# Patient Record
Sex: Female | Born: 1955
Health system: Southern US, Community
[De-identification: ages and names within clinical notes are randomized; demographics above are authoritative.]

## PROBLEM LIST (undated history)

## (undated) DIAGNOSIS — I2699 Other pulmonary embolism without acute cor pulmonale: Secondary | ICD-10-CM

## (undated) DIAGNOSIS — R739 Hyperglycemia, unspecified: Secondary | ICD-10-CM

## (undated) HISTORY — PX: BACK SURGERY: SHX140

## (undated) HISTORY — DX: Hyperglycemia, unspecified: R73.9

---

## 1999-09-20 ENCOUNTER — Other Ambulatory Visit: Admission: RE | Admit: 1999-09-20 | Discharge: 1999-09-20 | Payer: Self-pay | Admitting: Family Medicine

## 1999-10-19 ENCOUNTER — Encounter (INDEPENDENT_AMBULATORY_CARE_PROVIDER_SITE_OTHER): Payer: Self-pay

## 1999-10-19 ENCOUNTER — Other Ambulatory Visit: Admission: RE | Admit: 1999-10-19 | Discharge: 1999-10-19 | Payer: Self-pay | Admitting: Obstetrics and Gynecology

## 2000-10-01 ENCOUNTER — Other Ambulatory Visit: Admission: RE | Admit: 2000-10-01 | Discharge: 2000-10-01 | Payer: Self-pay | Admitting: Obstetrics and Gynecology

## 2000-10-09 ENCOUNTER — Encounter: Admission: RE | Admit: 2000-10-09 | Discharge: 2000-10-09 | Payer: Self-pay | Admitting: Obstetrics and Gynecology

## 2000-10-09 ENCOUNTER — Encounter: Payer: Self-pay | Admitting: Obstetrics and Gynecology

## 2001-12-02 ENCOUNTER — Encounter: Admission: RE | Admit: 2001-12-02 | Discharge: 2001-12-02 | Payer: Self-pay | Admitting: Obstetrics and Gynecology

## 2001-12-02 ENCOUNTER — Encounter: Payer: Self-pay | Admitting: Obstetrics and Gynecology

## 2001-12-31 ENCOUNTER — Other Ambulatory Visit: Admission: RE | Admit: 2001-12-31 | Discharge: 2001-12-31 | Payer: Self-pay | Admitting: Obstetrics and Gynecology

## 2003-02-19 ENCOUNTER — Encounter: Payer: Self-pay | Admitting: Obstetrics and Gynecology

## 2003-02-19 ENCOUNTER — Encounter: Admission: RE | Admit: 2003-02-19 | Discharge: 2003-02-19 | Payer: Self-pay | Admitting: Obstetrics and Gynecology

## 2003-04-20 ENCOUNTER — Other Ambulatory Visit: Admission: RE | Admit: 2003-04-20 | Discharge: 2003-04-20 | Payer: Self-pay | Admitting: Obstetrics and Gynecology

## 2004-04-19 ENCOUNTER — Encounter: Admission: RE | Admit: 2004-04-19 | Discharge: 2004-04-19 | Payer: Self-pay | Admitting: Obstetrics and Gynecology

## 2005-05-02 ENCOUNTER — Encounter: Admission: RE | Admit: 2005-05-02 | Discharge: 2005-05-02 | Payer: Self-pay | Admitting: Obstetrics and Gynecology

## 2009-10-19 ENCOUNTER — Encounter: Admission: RE | Admit: 2009-10-19 | Discharge: 2009-10-19 | Payer: Self-pay | Admitting: Obstetrics and Gynecology

## 2009-10-26 ENCOUNTER — Encounter: Admission: RE | Admit: 2009-10-26 | Discharge: 2009-10-26 | Payer: Self-pay | Admitting: Obstetrics and Gynecology

## 2010-08-14 ENCOUNTER — Encounter: Payer: Self-pay | Admitting: Obstetrics and Gynecology

## 2010-11-03 ENCOUNTER — Other Ambulatory Visit: Payer: Self-pay | Admitting: Obstetrics and Gynecology

## 2010-11-03 DIAGNOSIS — Z1231 Encounter for screening mammogram for malignant neoplasm of breast: Secondary | ICD-10-CM

## 2010-11-11 ENCOUNTER — Other Ambulatory Visit (HOSPITAL_BASED_OUTPATIENT_CLINIC_OR_DEPARTMENT_OTHER): Payer: Self-pay | Admitting: Family Medicine

## 2010-11-11 ENCOUNTER — Ambulatory Visit
Admission: RE | Admit: 2010-11-11 | Discharge: 2010-11-11 | Disposition: A | Discharge status: Home / Self Care | Payer: BC Managed Care – PPO | Source: Ambulatory Visit | Attending: Obstetrics and Gynecology | Admitting: Obstetrics and Gynecology

## 2010-11-11 DIAGNOSIS — E049 Nontoxic goiter, unspecified: Secondary | ICD-10-CM

## 2010-11-11 DIAGNOSIS — Z1231 Encounter for screening mammogram for malignant neoplasm of breast: Secondary | ICD-10-CM

## 2010-11-15 ENCOUNTER — Ambulatory Visit (HOSPITAL_BASED_OUTPATIENT_CLINIC_OR_DEPARTMENT_OTHER)
Admission: RE | Admit: 2010-11-15 | Discharge: 2010-11-15 | Disposition: A | Discharge status: Home / Self Care | Payer: BC Managed Care – PPO | Source: Ambulatory Visit | Attending: Family Medicine | Admitting: Family Medicine

## 2010-11-15 DIAGNOSIS — R1310 Dysphagia, unspecified: Secondary | ICD-10-CM | POA: Insufficient documentation

## 2010-11-15 DIAGNOSIS — E0789 Other specified disorders of thyroid: Secondary | ICD-10-CM | POA: Insufficient documentation

## 2010-11-15 DIAGNOSIS — E049 Nontoxic goiter, unspecified: Secondary | ICD-10-CM

## 2011-10-19 ENCOUNTER — Other Ambulatory Visit: Payer: Self-pay | Admitting: Obstetrics and Gynecology

## 2011-10-19 DIAGNOSIS — Z1231 Encounter for screening mammogram for malignant neoplasm of breast: Secondary | ICD-10-CM

## 2011-11-13 ENCOUNTER — Ambulatory Visit
Admission: RE | Admit: 2011-11-13 | Discharge: 2011-11-13 | Disposition: A | Discharge status: Home / Self Care | Payer: Managed Care, Other (non HMO) | Source: Ambulatory Visit | Attending: Obstetrics and Gynecology | Admitting: Obstetrics and Gynecology

## 2011-11-13 DIAGNOSIS — Z1231 Encounter for screening mammogram for malignant neoplasm of breast: Secondary | ICD-10-CM

## 2012-11-14 ENCOUNTER — Other Ambulatory Visit: Payer: Self-pay

## 2012-11-14 DIAGNOSIS — Z1231 Encounter for screening mammogram for malignant neoplasm of breast: Secondary | ICD-10-CM

## 2012-11-28 ENCOUNTER — Other Ambulatory Visit: Payer: Self-pay | Admitting: Obstetrics and Gynecology

## 2012-11-28 ENCOUNTER — Ambulatory Visit
Admission: RE | Admit: 2012-11-28 | Discharge: 2012-11-28 | Disposition: A | Discharge status: Home / Self Care | Payer: Managed Care, Other (non HMO) | Source: Ambulatory Visit

## 2012-11-28 DIAGNOSIS — R928 Other abnormal and inconclusive findings on diagnostic imaging of breast: Secondary | ICD-10-CM

## 2012-11-28 DIAGNOSIS — Z1231 Encounter for screening mammogram for malignant neoplasm of breast: Secondary | ICD-10-CM

## 2012-12-10 ENCOUNTER — Ambulatory Visit
Admission: RE | Admit: 2012-12-10 | Discharge: 2012-12-10 | Disposition: A | Discharge status: Home / Self Care | Payer: Managed Care, Other (non HMO) | Source: Ambulatory Visit | Attending: Obstetrics and Gynecology | Admitting: Obstetrics and Gynecology

## 2012-12-10 DIAGNOSIS — R928 Other abnormal and inconclusive findings on diagnostic imaging of breast: Secondary | ICD-10-CM

## 2013-10-31 ENCOUNTER — Other Ambulatory Visit: Payer: Self-pay

## 2013-10-31 DIAGNOSIS — Z1231 Encounter for screening mammogram for malignant neoplasm of breast: Secondary | ICD-10-CM

## 2013-12-01 ENCOUNTER — Ambulatory Visit
Admission: RE | Admit: 2013-12-01 | Discharge: 2013-12-01 | Disposition: A | Discharge status: Home / Self Care | Payer: Managed Care, Other (non HMO) | Source: Ambulatory Visit

## 2013-12-01 ENCOUNTER — Encounter (INDEPENDENT_AMBULATORY_CARE_PROVIDER_SITE_OTHER): Payer: Self-pay

## 2013-12-01 DIAGNOSIS — Z1231 Encounter for screening mammogram for malignant neoplasm of breast: Secondary | ICD-10-CM

## 2013-12-03 ENCOUNTER — Other Ambulatory Visit: Payer: Self-pay | Admitting: Obstetrics and Gynecology

## 2013-12-03 DIAGNOSIS — R928 Other abnormal and inconclusive findings on diagnostic imaging of breast: Secondary | ICD-10-CM

## 2013-12-17 ENCOUNTER — Ambulatory Visit
Admission: RE | Admit: 2013-12-17 | Discharge: 2013-12-17 | Disposition: A | Discharge status: Home / Self Care | Payer: Managed Care, Other (non HMO) | Source: Ambulatory Visit | Attending: Obstetrics and Gynecology | Admitting: Obstetrics and Gynecology

## 2013-12-17 DIAGNOSIS — R928 Other abnormal and inconclusive findings on diagnostic imaging of breast: Secondary | ICD-10-CM

## 2014-02-12 ENCOUNTER — Encounter: Payer: Self-pay | Admitting: *Deleted

## 2014-02-25 ENCOUNTER — Ambulatory Visit (INDEPENDENT_AMBULATORY_CARE_PROVIDER_SITE_OTHER): Payer: Managed Care, Other (non HMO) | Admitting: Cardiology

## 2014-02-25 ENCOUNTER — Encounter: Payer: Self-pay | Admitting: Cardiology

## 2014-02-25 VITALS — BP 126/80 | HR 69 | Ht 66.0 in | Wt 180.0 lb

## 2014-02-25 DIAGNOSIS — R002 Palpitations: Secondary | ICD-10-CM | POA: Insufficient documentation

## 2014-02-25 NOTE — Patient Instructions (Signed)
Your physician recommends that you schedule a follow-up appointment in: one year with Dr. Percival Spanish  We have ordered a stress test  We have ordered at CT [facility]

## 2014-02-25 NOTE — Progress Notes (Signed)
   HPI The patient presents for evaluation of palpitations. She had these several weeks ago when she saw her primary provider. She was told to stop caffeine which didn't help. She would notice his palpitations more at night but also occasionally during the day. She wasn't sure what it was irregular or just a strong beat. She's not had any presyncope with this. She could not make them, otherwise. They are less bothersome now. She has been a little short of breath at times walking up inclines. However, she's not having any resting shortness of breath and she denies any PND or orthopnea.  Allergies  Allergen Reactions  . Sulfa Antibiotics     No current outpatient prescriptions on file.   No current facility-administered medications for this visit.    No past medical history on file.  No past surgical history on file.  No family history on file.  History   Social History  . Marital Status: Married    Spouse Name: N/A    Number of Children: N/A  . Years of Education: N/A   Occupational History  . Not on file.   Social History Main Topics  . Smoking status: Former Research scientist (life sciences)  . Smokeless tobacco: Not on file     Comment: 8 years ago  . Alcohol Use: Not on file  . Drug Use: Not on file  . Sexual Activity: Not on file   Other Topics Concern  . Not on file   Social History Narrative  . No narrative on file    ROS:   As stated in the HPI and negative for all other systems.  PHYSICAL EXAM Ht 5\' 6"  (1.676 m)  Wt 180 lb (81.647 kg)  BMI 29.07 kg/m2 GENERAL:  Well appearing HEENT:  Pupils equal round and reactive, fundi not visualized, oral mucosa unremarkable NECK:  No jugular venous distention, waveform within normal limits, carotid upstroke brisk and symmetric, no bruits, no thyromegaly LYMPHATICS:  No cervical, inguinal adenopathy LUNGS:  Clear to auscultation bilaterally BACK:  No CVA tenderness CHEST:  Unremarkable HEART:  PMI not displaced or sustained,S1 and S2  within normal limits, no S3, no S4, no clicks, no rubs, no murmurs ABD:  Flat, positive bowel sounds normal in frequency in pitch, no bruits, no rebound, no guarding, no midline pulsatile mass, no hepatomegaly, no splenomegaly EXT:  2 plus pulses throughout, no edema, no cyanosis no clubbing SKIN:  No rashes no nodules NEURO:  Cranial nerves II through XII grossly intact, motor grossly intact throughout [facility]:  Cognitively intact, oriented to person place and time   EKG:  01/20/15  Sinus rhythm, rate 71, axis within normal limits, intervals within normal limits, no acute ST-T wave changes.  ASSESSMENT AND PLAN   PALPITATIONS:  We had a long discussion about this. This point she would like to avoid any change in medications. I have any further workup is indicated.  FAMILY HISTORY OF CAD:  In a strong family history I would like to bring her back for a POET (Plain Old Exercise Treadmill) And also a coronary calcium score which would help to guide therapy.

## 2014-03-11 ENCOUNTER — Other Ambulatory Visit: Payer: Managed Care, Other (non HMO)

## 2014-03-16 ENCOUNTER — Inpatient Hospital Stay: Admission: RE | Admit: 2014-03-16 | Payer: Managed Care, Other (non HMO) | Source: Ambulatory Visit

## 2014-03-16 ENCOUNTER — Ambulatory Visit (INDEPENDENT_AMBULATORY_CARE_PROVIDER_SITE_OTHER): Payer: Managed Care, Other (non HMO) | Admitting: Physician Assistant

## 2014-03-16 DIAGNOSIS — R002 Palpitations: Secondary | ICD-10-CM

## 2014-03-16 NOTE — Progress Notes (Signed)
Exercise Treadmill Test  Pre-Exercise Testing Evaluation Rhythm: normal sinus  Rate: 80     Test  Exercise Tolerance Test Ordering MD: Marijo File, MD  Interpreting MD: Richardson Dopp, PA-C  Unique Test No: 1  Treadmill:  1  Indication for ETT: Palpitations  Contraindication to ETT: No   Stress Modality: exercise - treadmill  Cardiac Imaging Performed: non   Protocol: standard Bruce - maximal  Max BP:  217/74  Max MPHR (bpm):  160 85% MPR (bpm) 136  MPHR obtained (bpm):  162 % MPHR obtained:  101  Reached 85% MPHR (min:sec):  2:45 Total Exercise Time (min-sec):  7:00  Workload in METS:  8.5 Borg Scale: 13  Reason ETT Terminated:  desired heart rate attained    ST Segment Analysis At Rest: normal ST segments - no evidence of significant ST depression With Exercise: non-specific ST changes  Other Information Arrhythmia:  No Angina during ETT:  absent (0) Quality of ETT:  diagnostic  ETT Interpretation:  normal - no evidence of ischemia by ST analysis  Comments: Fair exercise capacity. No chest pain. Normal BP response to exercise. Insignificant up-sloping ST depression at peak exercise. No ST depression to suggest ischemia. Occasional PVCs throughout exercise. Good HR recovery in 1st minute post-exercise.  Recommendations: FU with Dr. Minus Breeding as directed. Signed,  Richardson Dopp, PA-C   03/16/2014 11:11 AM

## 2014-03-17 ENCOUNTER — Ambulatory Visit (INDEPENDENT_AMBULATORY_CARE_PROVIDER_SITE_OTHER)
Admission: RE | Admit: 2014-03-17 | Discharge: 2014-03-17 | Disposition: A | Discharge status: Home / Self Care | Payer: Self-pay | Source: Ambulatory Visit | Attending: Cardiology | Admitting: Cardiology

## 2014-03-17 DIAGNOSIS — R002 Palpitations: Secondary | ICD-10-CM

## 2016-04-25 ENCOUNTER — Other Ambulatory Visit: Payer: Self-pay | Admitting: Obstetrics and Gynecology

## 2016-04-25 DIAGNOSIS — Z1231 Encounter for screening mammogram for malignant neoplasm of breast: Secondary | ICD-10-CM

## 2016-05-04 ENCOUNTER — Ambulatory Visit
Admission: RE | Admit: 2016-05-04 | Discharge: 2016-05-04 | Disposition: A | Discharge status: Home / Self Care | Payer: Managed Care, Other (non HMO) | Source: Ambulatory Visit | Attending: Obstetrics and Gynecology | Admitting: Obstetrics and Gynecology

## 2016-05-04 DIAGNOSIS — Z1231 Encounter for screening mammogram for malignant neoplasm of breast: Secondary | ICD-10-CM

## 2016-05-12 ENCOUNTER — Other Ambulatory Visit: Payer: Self-pay | Admitting: Obstetrics and Gynecology

## 2016-05-12 ENCOUNTER — Other Ambulatory Visit: Payer: Self-pay | Admitting: Internal Medicine

## 2016-05-12 DIAGNOSIS — E2839 Other primary ovarian failure: Secondary | ICD-10-CM

## 2016-05-12 DIAGNOSIS — Z780 Asymptomatic menopausal state: Secondary | ICD-10-CM

## 2016-05-16 ENCOUNTER — Ambulatory Visit
Admission: RE | Admit: 2016-05-16 | Discharge: 2016-05-16 | Disposition: A | Discharge status: Home / Self Care | Payer: Managed Care, Other (non HMO) | Source: Ambulatory Visit | Attending: Obstetrics and Gynecology | Admitting: Obstetrics and Gynecology

## 2016-05-16 DIAGNOSIS — E2839 Other primary ovarian failure: Secondary | ICD-10-CM

## 2017-04-12 ENCOUNTER — Encounter: Payer: Self-pay | Admitting: Family Medicine

## 2017-09-28 ENCOUNTER — Other Ambulatory Visit: Payer: Self-pay

## 2017-09-28 ENCOUNTER — Inpatient Hospital Stay (HOSPITAL_BASED_OUTPATIENT_CLINIC_OR_DEPARTMENT_OTHER)
Admission: EM | Admit: 2017-09-28 | Discharge: 2017-10-01 | DRG: 176 | Disposition: A | Discharge status: Home / Self Care | Payer: 59 | Attending: Internal Medicine | Admitting: Internal Medicine

## 2017-09-28 ENCOUNTER — Emergency Department (HOSPITAL_BASED_OUTPATIENT_CLINIC_OR_DEPARTMENT_OTHER): Payer: 59

## 2017-09-28 ENCOUNTER — Encounter (HOSPITAL_BASED_OUTPATIENT_CLINIC_OR_DEPARTMENT_OTHER): Payer: Self-pay | Admitting: *Deleted

## 2017-09-28 ENCOUNTER — Inpatient Hospital Stay (HOSPITAL_BASED_OUTPATIENT_CLINIC_OR_DEPARTMENT_OTHER): Payer: 59

## 2017-09-28 DIAGNOSIS — Z7982 Long term (current) use of aspirin: Secondary | ICD-10-CM | POA: Diagnosis not present

## 2017-09-28 DIAGNOSIS — Z87891 Personal history of nicotine dependence: Secondary | ICD-10-CM

## 2017-09-28 DIAGNOSIS — R0902 Hypoxemia: Secondary | ICD-10-CM | POA: Diagnosis present

## 2017-09-28 DIAGNOSIS — R911 Solitary pulmonary nodule: Secondary | ICD-10-CM | POA: Diagnosis present

## 2017-09-28 DIAGNOSIS — J441 Chronic obstructive pulmonary disease with (acute) exacerbation: Secondary | ICD-10-CM

## 2017-09-28 DIAGNOSIS — R0609 Other forms of dyspnea: Secondary | ICD-10-CM | POA: Diagnosis present

## 2017-09-28 DIAGNOSIS — I2699 Other pulmonary embolism without acute cor pulmonale: Secondary | ICD-10-CM | POA: Diagnosis present

## 2017-09-28 DIAGNOSIS — D696 Thrombocytopenia, unspecified: Secondary | ICD-10-CM | POA: Diagnosis present

## 2017-09-28 DIAGNOSIS — Z882 Allergy status to sulfonamides status: Secondary | ICD-10-CM

## 2017-09-28 DIAGNOSIS — I340 Nonrheumatic mitral (valve) insufficiency: Secondary | ICD-10-CM | POA: Diagnosis not present

## 2017-09-28 HISTORY — DX: Other pulmonary embolism without acute cor pulmonale: I26.99

## 2017-09-28 LAB — D-DIMER, QUANTITATIVE (NOT AT ARMC): D DIMER QUANT: 1.49 ug{FEU}/mL — AB (ref 0.00–0.50)

## 2017-09-28 LAB — CBC WITH DIFFERENTIAL/PLATELET
BASOS ABS: 0 10*3/uL (ref 0.0–0.1)
BASOS PCT: 0 %
EOS ABS: 0.2 10*3/uL (ref 0.0–0.7)
EOS PCT: 4 %
HCT: 37.2 % (ref 36.0–46.0)
Hemoglobin: 12.4 g/dL (ref 12.0–15.0)
LYMPHS ABS: 2.1 10*3/uL (ref 0.7–4.0)
Lymphocytes Relative: 39 %
MCH: 30.5 pg (ref 26.0–34.0)
MCHC: 33.3 g/dL (ref 30.0–36.0)
MCV: 91.4 fL (ref 78.0–100.0)
Monocytes Absolute: 0.5 10*3/uL (ref 0.1–1.0)
Monocytes Relative: 9 %
Neutro Abs: 2.6 10*3/uL (ref 1.7–7.7)
Neutrophils Relative %: 48 %
PLATELETS: 147 10*3/uL — AB (ref 150–400)
RBC: 4.07 MIL/uL (ref 3.87–5.11)
RDW: 12.8 % (ref 11.5–15.5)
WBC: 5.4 10*3/uL (ref 4.0–10.5)

## 2017-09-28 LAB — BASIC METABOLIC PANEL
Anion gap: 11 (ref 5–15)
BUN: 12 mg/dL (ref 6–20)
CALCIUM: 9.2 mg/dL (ref 8.9–10.3)
CO2: 26 mmol/L (ref 22–32)
CREATININE: 0.93 mg/dL (ref 0.44–1.00)
Chloride: 104 mmol/L (ref 101–111)
GFR calc Af Amer: >60 mL/min (ref >60)
Glucose, Bld: 112 mg/dL — ABNORMAL HIGH (ref 65–99)
Potassium: 3.2 mmol/L — ABNORMAL LOW (ref 3.5–5.1)
SODIUM: 141 mmol/L (ref 135–145)

## 2017-09-28 LAB — TROPONIN I: Troponin I: <0.03 ng/mL (ref <0.03)

## 2017-09-28 LAB — BRAIN NATRIURETIC PEPTIDE: B Natriuretic Peptide: 41.5 pg/mL (ref 0.0–100.0)

## 2017-09-28 MED ORDER — IPRATROPIUM-ALBUTEROL 0.5-2.5 (3) MG/3ML IN SOLN: 3.0000 mL | Freq: Three times a day (TID) | RESPIRATORY_TRACT | Status: DC

## 2017-09-28 MED ORDER — ONDANSETRON HCL 4 MG PO TABS: 4.0000 mg | ORAL_TABLET | Freq: Four times a day (QID) | ORAL | Status: DC | PRN

## 2017-09-28 MED ORDER — METHYLPREDNISOLONE SODIUM SUCC 125 MG IJ SOLR
125.0000 mg | Freq: Once | INTRAMUSCULAR | Status: AC
  Administered 2017-09-28: 125 mg via INTRAVENOUS
  Filled 2017-09-28: qty 2

## 2017-09-28 MED ORDER — IOPAMIDOL (ISOVUE-370) INJECTION 76%
100.0000 mL | Freq: Once | INTRAVENOUS | Status: AC | PRN
  Administered 2017-09-28: 100 mL via INTRAVENOUS

## 2017-09-28 MED ORDER — ALBUTEROL SULFATE (2.5 MG/3ML) 0.083% IN NEBU: 2.5000 mg | INHALATION_SOLUTION | RESPIRATORY_TRACT | Status: DC | PRN

## 2017-09-28 MED ORDER — ENOXAPARIN SODIUM 80 MG/0.8ML ~~LOC~~ SOLN
1.0000 mg/kg | Freq: Two times a day (BID) | SUBCUTANEOUS | Status: DC
  Administered 2017-09-29 – 2017-09-30 (×3): 75 mg via SUBCUTANEOUS
  Filled 2017-09-28 (×3): qty 0.8

## 2017-09-28 MED ORDER — ACETAMINOPHEN 650 MG RE SUPP: 650.0000 mg | Freq: Four times a day (QID) | RECTAL | Status: DC | PRN

## 2017-09-28 MED ORDER — ONDANSETRON HCL 4 MG/2ML IJ SOLN: 4.0000 mg | Freq: Four times a day (QID) | INTRAMUSCULAR | Status: DC | PRN

## 2017-09-28 MED ORDER — ACETAMINOPHEN 325 MG PO TABS
650.0000 mg | ORAL_TABLET | Freq: Four times a day (QID) | ORAL | Status: DC | PRN
  Administered 2017-09-29 – 2017-09-30 (×2): 650 mg via ORAL
  Filled 2017-09-28 (×3): qty 2

## 2017-09-28 MED ORDER — ADULT MULTIVITAMIN W/MINERALS CH
1.0000 | ORAL_TABLET | Freq: Every day | ORAL | Status: DC
  Administered 2017-09-28 – 2017-10-01 (×4): 1 via ORAL
  Filled 2017-09-28 (×4): qty 1

## 2017-09-28 MED ORDER — ASPIRIN EC 81 MG PO TBEC
81.0000 mg | DELAYED_RELEASE_TABLET | Freq: Every day | ORAL | Status: DC
  Administered 2017-09-28 – 2017-09-30 (×3): 81 mg via ORAL
  Filled 2017-09-28 (×3): qty 1

## 2017-09-28 MED ORDER — IPRATROPIUM-ALBUTEROL 0.5-2.5 (3) MG/3ML IN SOLN
3.0000 mL | Freq: Four times a day (QID) | RESPIRATORY_TRACT | Status: DC
  Administered 2017-09-28 (×2): 3 mL via RESPIRATORY_TRACT
  Filled 2017-09-28 (×2): qty 3

## 2017-09-28 MED ORDER — ENOXAPARIN SODIUM 80 MG/0.8ML ~~LOC~~ SOLN
1.0000 mg/kg | Freq: Once | SUBCUTANEOUS | Status: AC
  Administered 2017-09-28: 18:00:00 75 mg via SUBCUTANEOUS
  Filled 2017-09-28: qty 0.8

## 2017-09-28 NOTE — Progress Notes (Addendum)
ANTICOAGULATION CONSULT NOTE - Initial Consult  Pharmacy Consult for LMWH Indication: pulmonary embolus  Allergies  Allergen Reactions  . Sulfa Antibiotics Hives    Patient Measurements: Height: 5\' 6"  (167.6 cm) Weight: 170 lb (77.1 kg) IBW/kg (Calculated) : 59.3   Vital Signs: Temp: 98.4 F (36.9 C) (03/08 2059) Temp Source: Oral (03/08 2059) BP: 148/71 (03/08 2059) Pulse Rate: 84 (03/08 2059)  Labs: Recent Labs    09/28/17 1401  HGB 12.4  HCT 37.2  PLT 147*  CREATININE 0.93  TROPONINI <0.03    Estimated Creatinine Clearance: 64.1 mL/min (by C-G formula based on SCr of 0.93 mg/dL).   Medical History: Past Medical History:  Diagnosis Date  . Elevated blood sugar     Medications:  Medications Prior to Admission  Medication Sig Dispense Refill Last Dose  . aspirin 81 MG tablet Take 81 mg by mouth daily.   09/27/2017 at Unknown time  . KRILL OIL PO Take 750 mg by mouth. 600mg  daily    09/27/2017 at Unknown time  . Multiple Vitamin (MULTIVITAMIN) tablet Take 1 tablet by mouth daily.   09/27/2017 at Unknown time  . Vitamin D, Cholecalciferol, 1000 UNITS TABS Take by mouth. VITAMIN D # 3  2000ius daily   09/27/2017 at Unknown time  . azithromycin (ZITHROMAX) 250 MG tablet TAKE 2 TABLETS BY MOUTH TODAY, THEN TAKE 1 TABLET DAILY FOR 4 DAYS  0 09/25/2017 at Unknown time  . PROAIR RESPICLICK 854 (90 Base) MCG/ACT AEPB 2 puffs every 4 (four) hours.  0   . Probiotic Product (PROBIOTIC DAILY PO) Take by mouth.   Taking  . vitamin B-12 (CYANOCOBALAMIN) 1000 MCG tablet Take 1,000 mcg by mouth. liquiod  Also contains folic acid   Taking    Assessment: 62 yo F with RUL PE to start LMWH per pharmacy.  LMWH 75 mg given at [facility] at 1736.  Cr WNL, Hg WNL, PLTC low at 147.   Goal of Therapy:  Anti-Xa level 0.6-1 units/ml 4hrs after LMWH dose given Monitor platelets by anticoagulation protocol: Yes   Plan:  LMWH 1 mg/kg q12 F/u transition to oral agent  Eudelia Bunch,  Pharm.D. [phone] 09/28/2017 9:38 PM

## 2017-09-28 NOTE — ED Provider Notes (Signed)
[place] EMERGENCY DEPARTMENT Provider Note   CSN: [id number] Arrival date & time: 09/28/17  1232     History   Chief Complaint Chief Complaint  Patient presents with  . Cough    HPI Christine Donaldson is a 62 y.o. female.  She is a former smoker with no significant history of any respiratory disease.  She started with a cough and upper respiratory infection that was minimally productive of sputum over a week ago and when seen at an urgent care clinic was given an IV dose of steroids Z-Pak oral steroids and an inhaler.  This is the first time she has needed to use an inhaler.  She finished all these but still noticed that she was having dyspnea on exertion and this nonproductive cough and went back to the urgent care today.  The patient has at home pulse ox and had noticed that her saturations were in the mid 80s.  She does not require home oxygen ever before.  Today at the clinic they also noticed her pulse ox was low and placed her on oxygen which improved her symptoms.  She refused an ambulance transfer and was driven here by her husband for evaluation.  She states she does not have a fever no chest pain no leg pain no edema.  She has been eating and drinking well.  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  The history is provided by the patient.  Cough  This is a new problem. The current episode started more than 1 week ago. The problem occurs every few minutes. The cough is non-productive. There has been no fever. Associated symptoms include shortness of breath and wheezing. Pertinent negatives include no chest pain, no chills, no sweats, no ear pain, no headaches, no rhinorrhea, no sore throat and no myalgias. The treatment provided no relief. Smoker: former.    Past Medical History:  Diagnosis Date  . Elevated blood sugar     Patient Active Problem List   Diagnosis Date Noted  . Palpitations 02/25/2014     Past Surgical History:  Procedure Laterality Date  . BACK SURGERY     Lumbar    OB History    No data available       Home Medications    Prior to Admission medications   Medication Sig Start Date End Date Taking? Authorizing Provider  aspirin 81 MG tablet Take 81 mg by mouth daily.   Yes [provider]  KRILL OIL PO Take by mouth. 600mg  daily   Yes [provider]  Multiple Vitamin (MULTIVITAMIN) tablet Take 1 tablet by mouth daily.   Yes [provider]  Vitamin D, Cholecalciferol, 1000 UNITS TABS Take by mouth. VITAMIN D # 3  2000ius daily   Yes [provider]  Probiotic Product (PROBIOTIC DAILY PO) Take by mouth.    [provider]  vitamin B-12 (CYANOCOBALAMIN) 1000 MCG tablet Take 1,000 mcg by mouth. liquiod  Also contains folic acid    [provider]    Family History Family History  Problem Relation Age of Onset  . CAD Mother        Died age 8  . CAD Sister 76       Died at 18 of cirrhosis  . COPD Sister 34    Social History Social History   Tobacco Use  . Smoking status: Former Smoker  Packs/day: 1.00    Years: 32.00    Pack years: 32.00    Types: Cigarettes  . Smokeless tobacco: Never Used  . Tobacco comment: 8 years ago  Substance Use Topics  . Alcohol use: Not on file  . Drug use: Not on file     Allergies   Sulfa antibiotics   Review of Systems Review of Systems  Constitutional: Negative for chills and fever.  HENT: Negative for ear pain, rhinorrhea and sore throat.   Eyes: Negative for pain and visual disturbance.  Respiratory: Positive for cough, shortness of breath and wheezing.   Cardiovascular: Negative for chest pain and palpitations.  Gastrointestinal: Negative for abdominal pain and vomiting.  Genitourinary: Negative for dysuria and hematuria.  Musculoskeletal: Negative for arthralgias, back pain and myalgias.  Skin: Negative for color change and rash.   Neurological: Negative for seizures, syncope and headaches.  All other systems reviewed and are negative.    Physical Exam Updated Vital Signs BP (!) 173/93   Pulse (!) 101   Temp 98.9 F (37.2 C) (Oral)   Resp 19   Ht 5\' 6"  (1.676 m)   Wt 77.1 kg (170 lb)   SpO2 94%   BMI 27.44 kg/m   Physical Exam  Constitutional: She appears well-developed and well-nourished. No distress.  HENT:  Head: Normocephalic and atraumatic.  Eyes: Conjunctivae are normal.  Neck: Neck supple.  Cardiovascular: Regular rhythm. Bradycardia present.  No murmur heard. Pulmonary/Chest: Effort normal. No tachypnea. No respiratory distress. She has wheezes (scattered). She has rhonchi in the right lower field and the left lower field.  Abdominal: Soft. There is no tenderness.  Musculoskeletal: She exhibits no edema.  Neurological: She is alert.  Skin: Skin is warm and dry.  Psychiatric: She has a normal mood and affect.  Nursing note and vitals reviewed.    ED Treatments / Results  Labs (all labs ordered are listed, but only abnormal results are displayed) Labs Reviewed  TROPONIN I  BASIC METABOLIC PANEL  CBC WITH DIFFERENTIAL/PLATELET  BRAIN NATRIURETIC PEPTIDE    EKG  EKG Interpretation  Date/Time:  Friday September 28 2017 12:54:29 EST Ventricular Rate:  103 PR Interval:    QRS Duration: 83 QT Interval:  322 QTC Calculation: 422 R Axis:   84 Text Interpretation:  Sinus tachycardia Borderline right axis deviation no prior to compare with Confirmed by Aletta Edouard [phone]) on 09/28/2017 1:07:09 PM       Radiology Dg Chest 2 View  Result Date: 09/28/2017 CLINICAL DATA:  Shortness of breath. EXAM: CHEST - 2 VIEW COMPARISON:  03/17/2014 chest CT FINDINGS: The cardiomediastinal silhouette is unremarkable. Mild peribronchial thickening noted. There is no evidence of focal airspace disease, pulmonary edema, suspicious pulmonary nodule/mass, pleural effusion, or pneumothorax. No acute bony  abnormalities are identified. IMPRESSION: Mild peribronchial thickening without focal pneumonia. Electronically Signed   By: Margarette Canada M.D.   On: 09/28/2017 14:31   Ct Angio Chest Pe W/cm &/or Wo Cm  Result Date: 09/28/2017 CLINICAL DATA:  62 y/o F; recent viral respiratory infection on Z-Pak and prednisone. Elevated D-dimer and shortness of breath. EXAM: CT ANGIOGRAPHY CHEST WITH CONTRAST TECHNIQUE: Multidetector CT imaging of the chest was performed using the standard protocol during bolus administration of intravenous contrast. Multiplanar CT image reconstructions and MIPs were obtained to evaluate the vascular anatomy. CONTRAST:  152mL ISOVUE-370 IOPAMIDOL (ISOVUE-370) INJECTION 76% COMPARISON:  09/28/2017 chest radiograph FINDINGS: Cardiovascular: Satisfactory opacification of the pulmonary arteries to the segmental level. Acute segmental  pulmonary emboli in the right upper lobe (series 7, image 48). Normal heart size. No pericardial effusion. Normal caliber thoracic aorta with mild calcific atherosclerosis. Mediastinum/Nodes: No enlarged mediastinal, hilar, or axillary lymph nodes. Thyroid gland, trachea, and esophagus demonstrate no significant findings. Lungs/Pleura: Mild-to-moderate centrilobular emphysema greatest in upper lobes. Diffuse tiny centrilobular ground-glass nodules in bronchovascular distribution greater in the lung bases. 5 x 4 mm, average 4.5 mm solid nodule in the left upper lobe lingula (series 5, image 61). No pleural effusion or pneumothorax. Upper Abdomen: No acute abnormality. Musculoskeletal: No chest wall abnormality. No acute or significant osseous findings. Review of the MIP images confirms the above findings. IMPRESSION: 1. Acute segmental pulmonary emboli in the right upper lobe. No findings of right heart strain. 2. Tiny ground-glass nodules in bronchovascular distribution compatible bronchiolitis which may be infectious or possibly respiratory bronchiolitis given history of  smoking. 3. 4.5 mm solid nodule in left upper lobe lingula. No follow-up needed if patient is low-risk. Non-contrast chest CT can be considered in 12 months if patient is high-risk. This recommendation follows the consensus statement: Guidelines for Management of Incidental Pulmonary Nodules Detected on CT Images: From the Fleischner Society 2017; Radiology 2017; 284:228-243. 4. Mild to moderate emphysema of lung apices. 5. Aortic atherosclerosis. These results were called by telephone at the time of interpretation on 09/28/2017 at 5:08 pm to Dr. Aletta Edouard , who verbally acknowledged these results. Electronically Signed   By: Kristine Garbe M.D.   On: 09/28/2017 17:10    Procedures .Critical Care Performed by: Hayden Rasmussen, MD Authorized by: Hayden Rasmussen, MD   Critical care provider statement:    Critical care time (minutes):  30   Critical care time was exclusive of:  Separately billable procedures and treating other patients and teaching time   Critical care was necessary to treat or prevent imminent or life-threatening deterioration of the following conditions:  Respiratory failure   Critical care was time spent personally by me on the following activities:  Discussions with consultants, development of treatment plan with patient or surrogate, discussions with primary provider, evaluation of patient's response to treatment, examination of patient, obtaining history from patient or surrogate, ordering and performing treatments and interventions, ordering and review of laboratory studies, ordering and review of radiographic studies, pulse oximetry, re-evaluation of patient's condition and review of old charts   I assumed direction of critical care for this patient from another provider in my specialty: no     (including critical care time)  Medications Ordered in ED Medications - No data to display   Initial Impression / Assessment and Plan / ED Course  I have reviewed  the triage vital signs and the nursing notes.  Pertinent labs & imaging results that were available during my care of the patient were reviewed by me and considered in my medical decision making (see chart for details).  Clinical Course as of Sep 29 1909  Fri Sep 28, 2017  1516 Discussed with Dr. Halford Chessman pulmonary.  He felt that it may be worth getting a d-dimer to see if he would proceed with a CT PE but it did not sound like this should be a PE.  He agrees with admission and continued IV steroids treating as a COPD exacerbation.  I have paged the hospitalist for admission.  [MB]  1533 Discussed with Dr. [place] Pink hospitalist at [facility].  He is asking that we keep an eye over the d-dimer and if it is positive and  she is he not been transferred that she should get her PE study here.  I will signed out to my partner.  [MB]  2122 Call from radiology-patient has a small segmental right upper lobe PE.  No evidence of right heart strain.  She also has some groundglass opacities consistent with may be a chronic bronchiolitis and findings of COPD.  [MB]    Clinical Course User Index [MB] Hayden Rasmussen, MD      Final Clinical Impressions(s) / ED Diagnoses   Final diagnoses:  Other acute pulmonary embolism without acute cor pulmonale ([place])  COPD exacerbation [facility])    ED Discharge Orders    None       Hayden Rasmussen, MD 09/29/17 1912

## 2017-09-28 NOTE — ED Notes (Signed)
ED Provider at bedside. 

## 2017-09-28 NOTE — ED Notes (Signed)
Pt now requires oxygen that she normally would not require. Denies pain. Recently on antibiotics for viral infection. A/O NAD.

## 2017-09-28 NOTE — H&P (Signed)
History and Physical    Christine Donaldson UXL:[id number] DOB: 07/31/1955 DOA: 09/28/2017  PCP: Martinique, Betty G, MD  Patient coming from: Home.  Chief Complaint: Shortness of breath.  HPI: Christine Donaldson is a 62 y.o. female with history of tobacco abuse quit more than 10 years ago presented to the ER admits in [facility] with hypoxia.  Patient has been having shortness of breath with exertion over the last 1 week.  Had gone to urgent care center last week and was prescribed antibiotics and steroids for bronchitis.  Despite taking which she was still short of breath and mostly on exertion.  Has been having some nonproductive cough.  Denies any chest pain.  She went again to the urgent care center and over that patient was found to be hypoxic and was referred to the ER.  ED Course: In the ER patient was given nebulizer treatment and since patient's d-dimer was elevated at 1.49 CT angiogram of the chest was done which showed acute segmental pulmonary embolus involving the right upper lobe.  Also was seen features concerning for bronchiolitis.  Patient was started on Lovenox and admitted for further observation.  At the time of my exam at rest patient is not in short of breath.  Patient denies any previous history of pulmonary embolism or DVT.  Patient states her dad may have had a clot around the liver when he died.  Patient has had colonoscopy and is due for one which she gets done every 5 years.  She had a mammogram 2 years ago.  Denies any recent travel.  Has had a tooth extraction 3 weeks ago.  Review of Systems: As per HPI, rest all negative.   Past Medical History:  Diagnosis Date  . Elevated blood sugar     Past Surgical History:  Procedure Laterality Date  . BACK SURGERY     Lumbar     reports that she has quit smoking. Her smoking use included cigarettes. She has a 32.00 pack-year smoking history. she has never used smokeless tobacco. She reports that she does not drink alcohol.  Her drug history is not on file.  Allergies  Allergen Reactions  . Sulfa Antibiotics Hives    Family History  Problem Relation Age of Onset  . CAD Mother        Died age 53  . CAD Sister 38       Died at 68 of cirrhosis  . COPD Sister 45    Prior to Admission medications   Medication Sig Start Date End Date Taking? Authorizing Provider  aspirin 81 MG tablet Take 81 mg by mouth daily.   Yes [provider]  KRILL OIL PO Take 750 mg by mouth. 600mg  daily    Yes [provider]  Multiple Vitamin (MULTIVITAMIN) tablet Take 1 tablet by mouth daily.   Yes [provider]  Vitamin D, Cholecalciferol, 1000 UNITS TABS Take by mouth. VITAMIN D # 3  2000ius daily   Yes [provider]  azithromycin (ZITHROMAX) 250 MG tablet TAKE 2 TABLETS BY MOUTH TODAY, THEN TAKE 1 TABLET DAILY FOR 4 DAYS 09/21/17   [provider]  PROAIR RESPICLICK 536 (90 Base) MCG/ACT AEPB 2 puffs every 4 (four) hours. 09/21/17   [provider]  Probiotic Product (PROBIOTIC DAILY PO) Take by mouth.    [provider]  vitamin B-12 (CYANOCOBALAMIN) 1000 MCG tablet Take 1,000 mcg by mouth. liquiod  Also contains folic acid  [provider]    Physical Exam: Vitals:   09/28/17 1730 09/28/17 1830 09/28/17 2001 09/28/17 2059  BP: (!) 158/92 (!) 153/71  (!) 148/71  Pulse: 83 96  84  Resp: 20 16  18   Temp:  98.7 F (37.1 C)  98.4 F (36.9 C)  TempSrc:  Oral  Oral  SpO2: 90% 94% 92% 92%  Weight:      Height:          Constitutional: Moderately built and nourished. Vitals:   09/28/17 1730 09/28/17 1830 09/28/17 2001 09/28/17 2059  BP: (!) 158/92 (!) 153/71  (!) 148/71  Pulse: 83 96  84  Resp: 20 16  18   Temp:  98.7 F (37.1 C)  98.4 F (36.9 C)  TempSrc:  Oral  Oral  SpO2: 90% 94% 92% 92%  Weight:      Height:       Eyes: Anicteric no pallor. ENMT: No discharge from the ears eyes nose or mouth. Neck: No JVD appreciated no mass  felt. Respiratory: No rhonchi or crepitations. Cardiovascular: S1-S2 heard no murmurs appreciated. Abdomen: Soft nontender bowel sounds present. Musculoskeletal: No edema.  No joint effusion. Skin: No rash.  Skin appears warm. Neurologic: Alert awake oriented to time place and person.  Moves all extremities. Psychiatric: Appears normal.  Normal affect.   Labs on Admission: I have personally reviewed following labs and imaging studies  CBC: Recent Labs  Lab 09/28/17 1401  WBC 5.4  NEUTROABS 2.6  HGB 12.4  HCT 37.2  MCV 91.4  PLT 454*   Basic Metabolic Panel: Recent Labs  Lab 09/28/17 1401  NA 141  K 3.2*  CL 104  CO2 26  GLUCOSE 112*  BUN 12  CREATININE 0.93  CALCIUM 9.2   GFR: Estimated Creatinine Clearance: 64.1 mL/min (by C-G formula based on SCr of 0.93 mg/dL). Liver Function Tests: No results for input(s): AST, ALT, ALKPHOS, BILITOT, PROT, ALBUMIN in the last 168 hours. No results for input(s): LIPASE, AMYLASE in the last 168 hours. No results for input(s): AMMONIA in the last 168 hours. Coagulation Profile: No results for input(s): INR, PROTIME in the last 168 hours. Cardiac Enzymes: Recent Labs  Lab 09/28/17 1401  TROPONINI <0.03   BNP (last 3 results) No results for input(s): PROBNP in the last 8760 hours. HbA1C: No results for input(s): HGBA1C in the last 72 hours. CBG: No results for input(s): GLUCAP in the last 168 hours. Lipid Profile: No results for input(s): CHOL, HDL, LDLCALC, TRIG, CHOLHDL, LDLDIRECT in the last 72 hours. Thyroid Function Tests: No results for input(s): TSH, T4TOTAL, FREET4, T3FREE, THYROIDAB in the last 72 hours. Anemia Panel: No results for input(s): VITAMINB12, FOLATE, FERRITIN, TIBC, IRON, RETICCTPCT in the last 72 hours. Urine analysis: No results found for: COLORURINE, APPEARANCEUR, LABSPEC, PHURINE, GLUCOSEU, HGBUR, BILIRUBINUR, KETONESUR, PROTEINUR, UROBILINOGEN, NITRITE, LEUKOCYTESUR Sepsis  Labs: @LABRCNTIP (procalcitonin:4,lacticidven:4) )No results found for this or any previous visit (from the past 240 hour(s)).   Radiological Exams on Admission: Dg Chest 2 View  Result Date: 09/28/2017 CLINICAL DATA:  Shortness of breath. EXAM: CHEST - 2 VIEW COMPARISON:  03/17/2014 chest CT FINDINGS: The cardiomediastinal silhouette is unremarkable. Mild peribronchial thickening noted. There is no evidence of focal airspace disease, pulmonary edema, suspicious pulmonary nodule/mass, pleural effusion, or pneumothorax. No acute bony abnormalities are identified. IMPRESSION: Mild peribronchial thickening without focal pneumonia. Electronically Signed   By: Margarette Canada M.D.   On: 09/28/2017 14:31   Ct Angio Chest Pe W/cm &/or  Wo Cm  Result Date: 09/28/2017 CLINICAL DATA:  62 y/o F; recent viral respiratory infection on Z-Pak and prednisone. Elevated D-dimer and shortness of breath. EXAM: CT ANGIOGRAPHY CHEST WITH CONTRAST TECHNIQUE: Multidetector CT imaging of the chest was performed using the standard protocol during bolus administration of intravenous contrast. Multiplanar CT image reconstructions and MIPs were obtained to evaluate the vascular anatomy. CONTRAST:  186mL ISOVUE-370 IOPAMIDOL (ISOVUE-370) INJECTION 76% COMPARISON:  09/28/2017 chest radiograph FINDINGS: Cardiovascular: Satisfactory opacification of the pulmonary arteries to the segmental level. Acute segmental pulmonary emboli in the right upper lobe (series 7, image 48). Normal heart size. No pericardial effusion. Normal caliber thoracic aorta with mild calcific atherosclerosis. Mediastinum/Nodes: No enlarged mediastinal, hilar, or axillary lymph nodes. Thyroid gland, trachea, and esophagus demonstrate no significant findings. Lungs/Pleura: Mild-to-moderate centrilobular emphysema greatest in upper lobes. Diffuse tiny centrilobular ground-glass nodules in bronchovascular distribution greater in the lung bases. 5 x 4 mm, average 4.5 mm solid  nodule in the left upper lobe lingula (series 5, image 61). No pleural effusion or pneumothorax. Upper Abdomen: No acute abnormality. Musculoskeletal: No chest wall abnormality. No acute or significant osseous findings. Review of the MIP images confirms the above findings. IMPRESSION: 1. Acute segmental pulmonary emboli in the right upper lobe. No findings of right heart strain. 2. Tiny ground-glass nodules in bronchovascular distribution compatible bronchiolitis which may be infectious or possibly respiratory bronchiolitis given history of smoking. 3. 4.5 mm solid nodule in left upper lobe lingula. No follow-up needed if patient is low-risk. Non-contrast chest CT can be considered in 12 months if patient is high-risk. This recommendation follows the consensus statement: Guidelines for Management of Incidental Pulmonary Nodules Detected on CT Images: From the Fleischner Society 2017; Radiology 2017; 284:228-243. 4. Mild to moderate emphysema of lung apices. 5. Aortic atherosclerosis. These results were called by telephone at the time of interpretation on 09/28/2017 at 5:08 pm to Dr. Aletta Edouard , who verbally acknowledged these results. Electronically Signed   By: Kristine Garbe M.D.   On: 09/28/2017 17:10    EKG: Independently reviewed.  Sinus tachycardia.  Assessment/Plan Principal Problem:   Acute pulmonary embolism (HCC) Active Problems:   Hypoxia   Pulmonary nodule   Thrombocytopenia (HCC)    1. Acute pulmonary embolism unprovoked hemodynamically stable-CAT scan does not show any strain pattern.  BNP is 41 troponins were negative.  Since patient has exertional shortness of breath will check 2D echo.  Check Dopplers of the lower extremities.  Patient on Lovenox. 2. Exertional shortness of breath with CAT scan showing features of bronchiolitis -on exam patient is not wheezing will keep patient on PRN nebulizer treatment.  Will need referral to pulmonology as  outpatient. 3. Thrombocytopenia -no labs to compare.  Follow CBC. 4. Pulmonary nodule -further workup as outpatient.   DVT prophylaxis: Lovenox. Code Status: Full code. Family Communication: Discussed with patient. Disposition Plan: Home. Consults called: None. Admission status: Inpatient.   Rise Patience MD [facility] Pager [phone].  If 7PM-7AM, please contact night-coverage [email or web address] Password [facility]  09/28/2017, 10:24 PM

## 2017-09-28 NOTE — ED Notes (Signed)
Pharmacist at the bedside.

## 2017-09-28 NOTE — ED Notes (Signed)
Called [facility] with Cassie for Hospitalist

## 2017-09-28 NOTE — ED Triage Notes (Signed)
Cough she was seen at [facility] last week and given a zpak and prednisone. She was seen today at [facility] and given a breathing tx. Her CXR was negative.

## 2017-09-28 NOTE — ED Notes (Signed)
Patient transported to CT 

## 2017-09-29 ENCOUNTER — Inpatient Hospital Stay (HOSPITAL_COMMUNITY): Payer: 59

## 2017-09-29 DIAGNOSIS — I340 Nonrheumatic mitral (valve) insufficiency: Secondary | ICD-10-CM

## 2017-09-29 LAB — ECHOCARDIOGRAM COMPLETE
Height: 66 in
WEIGHTICAEL: 2720 [oz_av]

## 2017-09-29 LAB — CBC
HEMATOCRIT: 37.3 % (ref 36.0–46.0)
Hemoglobin: 12.4 g/dL (ref 12.0–15.0)
MCH: 30.1 pg (ref 26.0–34.0)
MCHC: 33.2 g/dL (ref 30.0–36.0)
MCV: 90.5 fL (ref 78.0–100.0)
Platelets: 217 10*3/uL (ref 150–400)
RBC: 4.12 MIL/uL (ref 3.87–5.11)
RDW: 12.7 % (ref 11.5–15.5)
WBC: 5.7 10*3/uL (ref 4.0–10.5)

## 2017-09-29 LAB — BASIC METABOLIC PANEL
Anion gap: 9 (ref 5–15)
BUN: 14 mg/dL (ref 6–20)
CHLORIDE: 106 mmol/L (ref 101–111)
CO2: 27 mmol/L (ref 22–32)
Calcium: 9.2 mg/dL (ref 8.9–10.3)
Creatinine, Ser: 0.8 mg/dL (ref 0.44–1.00)
GFR calc Af Amer: >60 mL/min (ref >60)
GFR calc non Af Amer: >60 mL/min (ref >60)
GLUCOSE: 141 mg/dL — AB (ref 65–99)
POTASSIUM: 3.9 mmol/L (ref 3.5–5.1)
Sodium: 142 mmol/L (ref 135–145)

## 2017-09-29 LAB — TROPONIN I
Troponin I: <0.03 ng/mL (ref <0.03)
Troponin I: <0.03 ng/mL (ref <0.03)

## 2017-09-29 LAB — HIV ANTIBODY (ROUTINE TESTING W REFLEX): HIV Screen 4th Generation wRfx: NONREACTIVE

## 2017-09-29 MED ORDER — ORAL CARE MOUTH RINSE
15.0000 mL | Freq: Two times a day (BID) | OROMUCOSAL | Status: DC
  Administered 2017-09-29 – 2017-10-01 (×3): 15 mL via OROMUCOSAL

## 2017-09-29 NOTE — Progress Notes (Signed)
  Echocardiogram 2D Echocardiogram has been performed.  Darlina Sicilian M 09/29/2017, 10:18 AM

## 2017-09-29 NOTE — Progress Notes (Signed)
PROGRESS NOTE    Christine Donaldson  TIW:[id number] DOB: 10/04/1955 DOA: 09/28/2017 PCP: Martinique, Betty G, MD   Brief Narrative: Patient is a 62 year old female with past medical history of tobacco abuse but quit 10 years ago, who presented to the emergency department at [facility] with complaints of shortness of breath.  Patient found to be hypoxic on presentation.  She was having shortness of breath with exertion over a week. CT angiogram of the chest done in the emergency department was found to be positive for acute segmental pulmonary embolus involving the right upper lobe.  D-dimer was also found to be elevated.  Patient was admitted for further management.  Anticoagulation started with Lovenox.  Assessment & Plan:   Principal Problem:   Acute pulmonary embolism (HCC) Active Problems:   Hypoxia   Pulmonary nodule   Thrombocytopenia (HCC)  Acute PE: Currently hemodynamically stable.  Still requires oxygen for saturation maintenance.  We will try to wean off oxygen. Echocardiogram was done which showed normal left ventricular function, mild left ventricular hypertrophy, no right ventricular strain. We will change Lovenox to Eliquis when she needs to discharge date.  Patient might be on anticoagulation indefinitely since this episode looks like unprovoked and she does not have any risk factors for PE or DVT.  Patient does not have any history of clotting disorders.  Dyspnea: Most likely associated with PE.  Continue supplemental oxygen as needed.  She is a former smoker.  Pulmonary nodule:4.5 mm solid nodule in left upper lobe lingula.  Non-contrast chest CT will be recommended in 12 months.   DVT prophylaxis:Lovenox  Code Status: Full Family Communication: None Disposition Plan: Home after resolution of dyspnea   Consultants: None  Procedures: Echocardiogram  Antimicrobials: None  Subjective:  Patient seen and examined the bedside this morning.  Remains comfortable.  Lungs  are clear.  Still needs oxygen for saturation maintenance. Objective: Vitals:   09/28/17 1830 09/28/17 2001 09/28/17 2059 09/29/17 0629  BP: (!) 153/71  (!) 148/71 138/81  Pulse: 96  84 72  Resp: 16  18 18   Temp: 98.7 F (37.1 C)  98.4 F (36.9 C) 98.7 F (37.1 C)  TempSrc: Oral  Oral Oral  SpO2: 94% 92% 92% 97%  Weight:      Height:       No intake or output data in the 24 hours ending 09/29/17 1425 Filed Weights   09/28/17 1244  Weight: 77.1 kg (170 lb)    Examination:  General exam: Appears calm and comfortable ,Not in distress,average built HEENT:PERRL,Oral mucosa moist, Ear/Nose normal on gross exam Respiratory system: Bilateral equal air entry, normal vesicular breath sounds, no wheezes or crackles  Cardiovascular system: S1 & S2 heard, RRR. No JVD, murmurs, rubs, gallops or clicks. No pedal edema. Gastrointestinal system: Abdomen is nondistended, soft and nontender. No organomegaly or masses felt. Normal bowel sounds heard. Central nervous system: Alert and oriented. No focal neurological deficits. Extremities: No edema, no clubbing ,no cyanosis, distal peripheral pulses palpable. Skin: No rashes, lesions or ulcers,no icterus ,no pallor MSK: Normal muscle bulk,tone ,power Psychiatry: Judgement and insight appear normal. Mood & affect appropriate.     Data Reviewed: I have personally reviewed following labs and imaging studies  CBC: Recent Labs  Lab 09/28/17 1401 09/29/17 0451  WBC 5.4 5.7  NEUTROABS 2.6  --   HGB 12.4 12.4  HCT 37.2 37.3  MCV 91.4 90.5  PLT 147* 250   Basic Metabolic Panel: Recent Labs  Lab  09/28/17 1401 09/29/17 0451  NA 141 142  K 3.2* 3.9  CL 104 106  CO2 26 27  GLUCOSE 112* 141*  BUN 12 14  CREATININE 0.93 0.80  CALCIUM 9.2 9.2   GFR: Estimated Creatinine Clearance: 74.5 mL/min (by C-G formula based on SCr of 0.8 mg/dL). Liver Function Tests: No results for input(s): AST, ALT, ALKPHOS, BILITOT, PROT, ALBUMIN in the last  168 hours. No results for input(s): LIPASE, AMYLASE in the last 168 hours. No results for input(s): AMMONIA in the last 168 hours. Coagulation Profile: No results for input(s): INR, PROTIME in the last 168 hours. Cardiac Enzymes: Recent Labs  Lab 09/28/17 1401 09/28/17 2229 09/29/17 0451 09/29/17 1003  TROPONINI <0.03 <0.03 <0.03 <0.03   BNP (last 3 results) No results for input(s): PROBNP in the last 8760 hours. HbA1C: No results for input(s): HGBA1C in the last 72 hours. CBG: No results for input(s): GLUCAP in the last 168 hours. Lipid Profile: No results for input(s): CHOL, HDL, LDLCALC, TRIG, CHOLHDL, LDLDIRECT in the last 72 hours. Thyroid Function Tests: No results for input(s): TSH, T4TOTAL, FREET4, T3FREE, THYROIDAB in the last 72 hours. Anemia Panel: No results for input(s): VITAMINB12, FOLATE, FERRITIN, TIBC, IRON, RETICCTPCT in the last 72 hours. Sepsis Labs: No results for input(s): PROCALCITON, LATICACIDVEN in the last 168 hours.  No results found for this or any previous visit (from the past 240 hour(s)).       Radiology Studies: Dg Chest 2 View  Result Date: 09/28/2017 CLINICAL DATA:  Shortness of breath. EXAM: CHEST - 2 VIEW COMPARISON:  03/17/2014 chest CT FINDINGS: The cardiomediastinal silhouette is unremarkable. Mild peribronchial thickening noted. There is no evidence of focal airspace disease, pulmonary edema, suspicious pulmonary nodule/mass, pleural effusion, or pneumothorax. No acute bony abnormalities are identified. IMPRESSION: Mild peribronchial thickening without focal pneumonia. Electronically Signed   By: Margarette Canada M.D.   On: 09/28/2017 14:31   Ct Angio Chest Pe W/cm &/or Wo Cm  Result Date: 09/28/2017 CLINICAL DATA:  62 y/o F; recent viral respiratory infection on Z-Pak and prednisone. Elevated D-dimer and shortness of breath. EXAM: CT ANGIOGRAPHY CHEST WITH CONTRAST TECHNIQUE: Multidetector CT imaging of the chest was performed using the  standard protocol during bolus administration of intravenous contrast. Multiplanar CT image reconstructions and MIPs were obtained to evaluate the vascular anatomy. CONTRAST:  171mL ISOVUE-370 IOPAMIDOL (ISOVUE-370) INJECTION 76% COMPARISON:  09/28/2017 chest radiograph FINDINGS: Cardiovascular: Satisfactory opacification of the pulmonary arteries to the segmental level. Acute segmental pulmonary emboli in the right upper lobe (series 7, image 48). Normal heart size. No pericardial effusion. Normal caliber thoracic aorta with mild calcific atherosclerosis. Mediastinum/Nodes: No enlarged mediastinal, hilar, or axillary lymph nodes. Thyroid gland, trachea, and esophagus demonstrate no significant findings. Lungs/Pleura: Mild-to-moderate centrilobular emphysema greatest in upper lobes. Diffuse tiny centrilobular ground-glass nodules in bronchovascular distribution greater in the lung bases. 5 x 4 mm, average 4.5 mm solid nodule in the left upper lobe lingula (series 5, image 61). No pleural effusion or pneumothorax. Upper Abdomen: No acute abnormality. Musculoskeletal: No chest wall abnormality. No acute or significant osseous findings. Review of the MIP images confirms the above findings. IMPRESSION: 1. Acute segmental pulmonary emboli in the right upper lobe. No findings of right heart strain. 2. Tiny ground-glass nodules in bronchovascular distribution compatible bronchiolitis which may be infectious or possibly respiratory bronchiolitis given history of smoking. 3. 4.5 mm solid nodule in left upper lobe lingula. No follow-up needed if patient is low-risk. Non-contrast chest CT  can be considered in 12 months if patient is high-risk. This recommendation follows the consensus statement: Guidelines for Management of Incidental Pulmonary Nodules Detected on CT Images: From the Fleischner Society 2017; Radiology 2017; 284:228-243. 4. Mild to moderate emphysema of lung apices. 5. Aortic atherosclerosis. These results were  called by telephone at the time of interpretation on 09/28/2017 at 5:08 pm to Dr. Aletta Edouard , who verbally acknowledged these results. Electronically Signed   By: Kristine Garbe M.D.   On: 09/28/2017 17:10        Scheduled Meds: . aspirin EC  81 mg Oral Daily  . enoxaparin (LOVENOX) injection  1 mg/kg Subcutaneous Q12H  . mouth rinse  15 mL Mouth Rinse BID  . multivitamin with minerals  1 tablet Oral Daily   Continuous Infusions:   LOS: 1 day    Time spent: 25  mins.More than 50% of that time was spent in counseling and/or coordination of care.      Marene Lenz, MD [facility] Pager [phone]  If 7PM-7AM, please contact night-coverage [email or web address] Password [email or web address] 09/29/2017, 2:25 PM

## 2017-09-29 NOTE — Plan of Care (Signed)
Patient stable during 7 a to 7 p shift, denies any chest pain, some minimal dyspnea on exertion.  Patient able to wean down from 4 liters to 2 liters this shift and maintain sats between 90-95%.  Medicated for headache x 1 with improvement.  Husband at bedside most of shift.

## 2017-09-30 ENCOUNTER — Inpatient Hospital Stay (HOSPITAL_COMMUNITY): Payer: 59

## 2017-09-30 DIAGNOSIS — I2699 Other pulmonary embolism without acute cor pulmonale: Secondary | ICD-10-CM

## 2017-09-30 DIAGNOSIS — R0902 Hypoxemia: Secondary | ICD-10-CM

## 2017-09-30 MED ORDER — APIXABAN 5 MG PO TABS
10.0000 mg | ORAL_TABLET | Freq: Two times a day (BID) | ORAL | Status: DC
  Administered 2017-09-30 – 2017-10-01 (×2): 10 mg via ORAL
  Filled 2017-09-30 (×2): qty 2

## 2017-09-30 MED ORDER — APIXABAN 5 MG PO TABS: 5.0000 mg | ORAL_TABLET | Freq: Two times a day (BID) | ORAL | Status: DC

## 2017-09-30 NOTE — Progress Notes (Signed)
Patient ambulatory in hallway, oxygen level 88-90% on room air prior to ambulation but did drop down to 77% after walking about half the length of the unit.  Patient denied any dyspnea however tachycardiac (110's)  and did state she felt somewhat light headed. After resting patients oxygen saturation remained between 90-92%.

## 2017-09-30 NOTE — Progress Notes (Signed)
PROGRESS NOTE    Christine Donaldson  NAT:[id number] DOB: [birth year] DOA: 09/28/2017 PCP: Martinique, Betty G, MD   Brief Narrative: Patient is a 62 year old female with past medical history of tobacco abuse but quit 10 years ago, who presented to the emergency department at [facility] with complaints of shortness of breath.  Patient found to be hypoxic on presentation.  She was having shortness of breath with exertion over a week. CT angiogram of the chest done in the emergency department was found to be positive for acute segmental pulmonary embolus involving the right upper lobe.  D-dimer was also found to be elevated.  Patient was admitted for further management.  Anticoagulation started with Lovenox  which has been changed to Eliquis today. Assessment & Plan:   Principal Problem:   Acute pulmonary embolism (HCC) Active Problems:   Hypoxia   Pulmonary nodule   Thrombocytopenia (HCC)  Acute PE: Currently hemodynamically stable.  Easily desaturates with ambulation but her room air saturation is acceptable.   Echocardiogram was done which showed normal left ventricular function, mild left ventricular hypertrophy, no right ventricular strain. We will change Lovenox to Eliquis .  Patient might be on anticoagulation indefinitely since this episode looks like unprovoked and she does not have any risk factors for PE or DVT.  Patient does not have any history of clotting disorders.  Dyspnea: Most likely associated with PE.  Continue supplemental oxygen as needed.  She is a former smoker.  Pulmonary nodule:4.5 mm solid nodule in left upper lobe lingula.  Non-contrast chest CT will be recommended in 12 months.   DVT prophylaxis:Lovenox  Code Status: Full Family Communication: None Disposition Plan: Home after resolution of dyspnea likely tomorrow   Consultants: None  Procedures: Echocardiogram  Antimicrobials: None  Subjective: Patient seen and examined at bedside this morning.  Remains  very comfortable.  Saturating fine on room air.  She was made to ambulate in the hallway but she desaturated.  Held discharge for today. Objective: Vitals:   09/29/17 1800 09/29/17 2219 09/30/17 0632 09/30/17 1057  BP:  125/66 140/84   Pulse:  82 74   Resp:  18 18   Temp:  98.7 F (37.1 C) 97.6 F (36.4 C)   TempSrc:  Oral Oral   SpO2: 95% 95% 92% 92%  Weight:      Height:       No intake or output data in the 24 hours ending 09/30/17 1253 Filed Weights   09/28/17 1244  Weight: 77.1 kg (170 lb)    Examination:  General exam: Appears calm and comfortable ,Not in distress,average built HEENT:PERRL,Oral mucosa moist, Ear/Nose normal on gross exam Respiratory system: Bilateral equal air entry, normal vesicular breath sounds, no wheezes or crackles  Cardiovascular system: S1 & S2 heard, RRR. No JVD, murmurs, rubs, gallops or clicks. Gastrointestinal system: Abdomen is nondistended, soft and nontender. No organomegaly or masses felt. Normal bowel sounds heard. Central nervous system: Alert and oriented. No focal neurological deficits. Extremities: No edema, no clubbing ,no cyanosis, distal peripheral pulses palpable. Skin: No rashes, lesions or ulcers,no icterus ,no pallor MSK: Normal muscle bulk,tone ,power Psychiatry: Judgement and insight appear normal. Mood & affect appropriate.        Data Reviewed: I have personally reviewed following labs and imaging studies  CBC: Recent Labs  Lab 09/28/17 1401 09/29/17 0451  WBC 5.4 5.7  NEUTROABS 2.6  --   HGB 12.4 12.4  HCT 37.2 37.3  MCV 91.4 90.5  PLT 147*  361   Basic Metabolic Panel: Recent Labs  Lab 09/28/17 1401 09/29/17 0451  NA 141 142  K 3.2* 3.9  CL 104 106  CO2 26 27  GLUCOSE 112* 141*  BUN 12 14  CREATININE 0.93 0.80  CALCIUM 9.2 9.2   GFR: Estimated Creatinine Clearance: 74.5 mL/min (by C-G formula based on SCr of 0.8 mg/dL). Liver Function Tests: No results for input(s): AST, ALT, ALKPHOS, BILITOT,  PROT, ALBUMIN in the last 168 hours. No results for input(s): LIPASE, AMYLASE in the last 168 hours. No results for input(s): AMMONIA in the last 168 hours. Coagulation Profile: No results for input(s): INR, PROTIME in the last 168 hours. Cardiac Enzymes: Recent Labs  Lab 09/28/17 1401 09/28/17 2229 09/29/17 0451 09/29/17 1003  TROPONINI <0.03 <0.03 <0.03 <0.03   BNP (last 3 results) No results for input(s): PROBNP in the last 8760 hours. HbA1C: No results for input(s): HGBA1C in the last 72 hours. CBG: No results for input(s): GLUCAP in the last 168 hours. Lipid Profile: No results for input(s): CHOL, HDL, LDLCALC, TRIG, CHOLHDL, LDLDIRECT in the last 72 hours. Thyroid Function Tests: No results for input(s): TSH, T4TOTAL, FREET4, T3FREE, THYROIDAB in the last 72 hours. Anemia Panel: No results for input(s): VITAMINB12, FOLATE, FERRITIN, TIBC, IRON, RETICCTPCT in the last 72 hours. Sepsis Labs: No results for input(s): PROCALCITON, LATICACIDVEN in the last 168 hours.  No results found for this or any previous visit (from the past 240 hour(s)).       Radiology Studies: Dg Chest 2 View  Result Date: 09/28/2017 CLINICAL DATA:  Shortness of breath. EXAM: CHEST - 2 VIEW COMPARISON:  03/17/2014 chest CT FINDINGS: The cardiomediastinal silhouette is unremarkable. Mild peribronchial thickening noted. There is no evidence of focal airspace disease, pulmonary edema, suspicious pulmonary nodule/mass, pleural effusion, or pneumothorax. No acute bony abnormalities are identified. IMPRESSION: Mild peribronchial thickening without focal pneumonia. Electronically Signed   By: Margarette Canada M.D.   On: 09/28/2017 14:31   Ct Angio Chest Pe W/cm &/or Wo Cm  Result Date: 09/28/2017 CLINICAL DATA:  62 y/o F; recent viral respiratory infection on Z-Pak and prednisone. Elevated D-dimer and shortness of breath. EXAM: CT ANGIOGRAPHY CHEST WITH CONTRAST TECHNIQUE: Multidetector CT imaging of the chest  was performed using the standard protocol during bolus administration of intravenous contrast. Multiplanar CT image reconstructions and MIPs were obtained to evaluate the vascular anatomy. CONTRAST:  11mL ISOVUE-370 IOPAMIDOL (ISOVUE-370) INJECTION 76% COMPARISON:  09/28/2017 chest radiograph FINDINGS: Cardiovascular: Satisfactory opacification of the pulmonary arteries to the segmental level. Acute segmental pulmonary emboli in the right upper lobe (series 7, image 48). Normal heart size. No pericardial effusion. Normal caliber thoracic aorta with mild calcific atherosclerosis. Mediastinum/Nodes: No enlarged mediastinal, hilar, or axillary lymph nodes. Thyroid gland, trachea, and esophagus demonstrate no significant findings. Lungs/Pleura: Mild-to-moderate centrilobular emphysema greatest in upper lobes. Diffuse tiny centrilobular ground-glass nodules in bronchovascular distribution greater in the lung bases. 5 x 4 mm, average 4.5 mm solid nodule in the left upper lobe lingula (series 5, image 61). No pleural effusion or pneumothorax. Upper Abdomen: No acute abnormality. Musculoskeletal: No chest wall abnormality. No acute or significant osseous findings. Review of the MIP images confirms the above findings. IMPRESSION: 1. Acute segmental pulmonary emboli in the right upper lobe. No findings of right heart strain. 2. Tiny ground-glass nodules in bronchovascular distribution compatible bronchiolitis which may be infectious or possibly respiratory bronchiolitis given history of smoking. 3. 4.5 mm solid nodule in left upper lobe lingula.  No follow-up needed if patient is low-risk. Non-contrast chest CT can be considered in 12 months if patient is high-risk. This recommendation follows the consensus statement: Guidelines for Management of Incidental Pulmonary Nodules Detected on CT Images: From the Fleischner Society 2017; Radiology 2017; 284:228-243. 4. Mild to moderate emphysema of lung apices. 5. Aortic  atherosclerosis. These results were called by telephone at the time of interpretation on 09/28/2017 at 5:08 pm to Dr. Aletta Edouard , who verbally acknowledged these results. Electronically Signed   By: Kristine Garbe M.D.   On: 09/28/2017 17:10        Scheduled Meds: . apixaban  10 mg Oral BID  . [START ON 10/07/2017] apixaban  5 mg Oral BID  . mouth rinse  15 mL Mouth Rinse BID  . multivitamin with minerals  1 tablet Oral Daily   Continuous Infusions:   LOS: 2 days    Time spent: 25  mins.More than 50% of that time was spent in counseling and/or coordination of care.      Marene Lenz, MD [facility] Pager [phone]  If 7PM-7AM, please contact night-coverage [email or web address] Password [email or web address] 09/30/2017, 12:53 PM

## 2017-09-30 NOTE — Progress Notes (Signed)
Bilateral lower extremity venous duplex has been completed. Negative for DVT.  09/30/17 8:48 AM Christine Donaldson RVT

## 2017-09-30 NOTE — Plan of Care (Signed)
Patient stable, able to wean down to 1 liter of oxygen to maintain sats above 90%.  Educational handout given for Eliquis.

## 2017-10-01 MED ORDER — APIXABAN 5 MG PO TABS: 10.0000 mg | ORAL_TABLET | Freq: Two times a day (BID) | ORAL | 0 refills | Status: DC

## 2017-10-01 MED ORDER — APIXABAN 5 MG PO TABS: 5.0000 mg | ORAL_TABLET | Freq: Two times a day (BID) | ORAL | 0 refills | Status: DC

## 2017-10-01 MED ORDER — GUAIFENESIN ER 600 MG PO TB12: 600.0000 mg | ORAL_TABLET | Freq: Two times a day (BID) | ORAL | 0 refills | Status: DC

## 2017-10-01 MED ORDER — GUAIFENESIN ER 600 MG PO TB12
600.0000 mg | ORAL_TABLET | Freq: Two times a day (BID) | ORAL | Status: DC
  Administered 2017-10-01: 600 mg via ORAL
  Filled 2017-10-01: qty 1

## 2017-10-01 NOTE — Discharge Summary (Addendum)
Physician Discharge Summary  Tene Gato Ganas MWU:[id number] DOB: Feb 20, 1956 DOA: 09/28/2017  PCP: Martinique, Betty G, MD  Admit date: 09/28/2017 Discharge date: 10/01/2017  Admitted From: Home Disposition: Home  Discharge Condition: Stable CODE STATUS:Full Diet recommendation: Regular   Brief/Interim Summary: Patient is a 62 year old female with past medical history of tobacco abuse but quit 10 years ago, who presented to the emergency department at [facility] with complaints of shortness of breath.  Patient found to be hypoxic on presentation.  She was having shortness of breath with exertion over a week. CT angiogram of the chest done in the emergency department was found to be positive for acute segmental pulmonary embolus involving the right upper lobe.  D-dimer was also found to be elevated.  Patient was admitted for further management.  Anticoagulation started with Lovenox  which has been changed to Eliquis.  Currently patient's respiratory status is stable.  She is  saturating fine without supplemental oxygen. She is stable for discharge to home today.  Following problems were addressed during her hospitalization:  Acute PE: Currently hemodynamically stable.  Echocardiogram was done which showed normal left ventricular function, mild left ventricular hypertrophy, no right ventricular strain. Started on Eliquis .  Patient might be on anticoagulation indefinitely since this episode looks like unprovoked and she does not have any risk factors for PE or DVT.  Patient does not have any history of clotting disorders.  Dyspnea: Most likely associated with PE.  She has been able to maintain her saturation without oxygen today. She is a former smoker.  Pulmonary nodule:4.5 mm solid nodule in left upper lobe lingula.  Non-contrast chest CT will be recommended in 12 months.     Discharge Diagnoses:  Principal Problem:   Acute pulmonary embolism ([place]) Active Problems:   Hypoxia   Pulmonary  nodule   Thrombocytopenia [facility])    Discharge Instructions  Discharge Instructions    Diet general   Complete by:  As directed    Discharge instructions   Complete by:  As directed    1) Take prescribed medications as instructed. 2) Follow up with your PCP in a week. 3)Please do a  Non-contrast CT of the chest  in 12 months to follow up for the pulmonary nodule that was found in the CT imaging here.   Increase activity slowly   Complete by:  As directed      Allergies as of 10/01/2017      Reactions   Sulfa Antibiotics Hives      Medication List    STOP taking these medications   aspirin 81 MG tablet   azithromycin 250 MG tablet Commonly known as:  ZITHROMAX   predniSONE 10 MG tablet Commonly known as:  DELTASONE     TAKE these medications   apixaban 5 MG Tabs tablet Commonly known as:  ELIQUIS Take 2 tablets (10 mg total) by mouth 2 (two) times daily.   apixaban 5 MG Tabs tablet Commonly known as:  ELIQUIS Take 1 tablet (5 mg total) by mouth 2 (two) times daily. Start taking on:  10/07/2017   guaiFENesin 600 MG 12 hr tablet Commonly known as:  MUCINEX Take 1 tablet (600 mg total) by mouth 2 (two) times daily.   KRILL OIL PO Take 750 mg by mouth. 600mg  daily   multivitamin tablet Take 1 tablet by mouth daily.   PROAIR RESPICLICK 725 (90 Base) MCG/ACT Aepb Generic drug:  Albuterol Sulfate 2 puffs every 4 (four) hours.   PROBIOTIC DAILY  PO Take by mouth.   vitamin B-12 1000 MCG tablet Commonly known as:  CYANOCOBALAMIN Take 1,000 mcg by mouth. liquiod  Also contains folic acid   Vitamin D (Cholecalciferol) 1000 units Tabs Take by mouth. VITAMIN D # 3  2000ius daily      Follow-up Information    Martin[place], Betty G, MD. Schedule an appointment as soon as possible for a visit in 1 week(s).   Specialty:  Family Medicine Contact information: [place] [address] [phone]          Allergies  Allergen Reactions  .  Sulfa Antibiotics Hives    Consultations:  None   Procedures/Studies: Dg Chest 2 View  Result Date: 09/28/2017 CLINICAL DATA:  Shortness of breath. EXAM: CHEST - 2 VIEW COMPARISON:  03/17/2014 chest CT FINDINGS: The cardiomediastinal silhouette is unremarkable. Mild peribronchial thickening noted. There is no evidence of focal airspace disease, pulmonary edema, suspicious pulmonary nodule/mass, pleural effusion, or pneumothorax. No acute bony abnormalities are identified. IMPRESSION: Mild peribronchial thickening without focal pneumonia. Electronically Signed   By: Margarette Canada M.D.   On: 09/28/2017 14:31   Ct Angio Chest Pe W/cm &/or Wo Cm  Result Date: 09/28/2017 CLINICAL DATA:  62 y/o F; recent viral respiratory infection on Z-Pak and prednisone. Elevated D-dimer and shortness of breath. EXAM: CT ANGIOGRAPHY CHEST WITH CONTRAST TECHNIQUE: Multidetector CT imaging of the chest was performed using the standard protocol during bolus administration of intravenous contrast. Multiplanar CT image reconstructions and MIPs were obtained to evaluate the vascular anatomy. CONTRAST:  155mL ISOVUE-370 IOPAMIDOL (ISOVUE-370) INJECTION 76% COMPARISON:  09/28/2017 chest radiograph FINDINGS: Cardiovascular: Satisfactory opacification of the pulmonary arteries to the segmental level. Acute segmental pulmonary emboli in the right upper lobe (series 7, image 48). Normal heart size. No pericardial effusion. Normal caliber thoracic aorta with mild calcific atherosclerosis. Mediastinum/Nodes: No enlarged mediastinal, hilar, or axillary lymph nodes. Thyroid gland, trachea, and esophagus demonstrate no significant findings. Lungs/Pleura: Mild-to-moderate centrilobular emphysema greatest in upper lobes. Diffuse tiny centrilobular ground-glass nodules in bronchovascular distribution greater in the lung bases. 5 x 4 mm, average 4.5 mm solid nodule in the left upper lobe lingula (series 5, image 61). No pleural effusion or  pneumothorax. Upper Abdomen: No acute abnormality. Musculoskeletal: No chest wall abnormality. No acute or significant osseous findings. Review of the MIP images confirms the above findings. IMPRESSION: 1. Acute segmental pulmonary emboli in the right upper lobe. No findings of right heart strain. 2. Tiny ground-glass nodules in bronchovascular distribution compatible bronchiolitis which may be infectious or possibly respiratory bronchiolitis given history of smoking. 3. 4.5 mm solid nodule in left upper lobe lingula. No follow-up needed if patient is low-risk. Non-contrast chest CT can be considered in 12 months if patient is high-risk. This recommendation follows the consensus statement: Guidelines for Management of Incidental Pulmonary Nodules Detected on CT Images: From the Fleischner Society 2017; Radiology 2017; 284:228-243. 4. Mild to moderate emphysema of lung apices. 5. Aortic atherosclerosis. These results were called by telephone at the time of interpretation on 09/28/2017 at 5:08 pm to Dr. Aletta Edouard , who verbally acknowledged these results. Electronically Signed   By: Kristine Garbe M.D.   On: 09/28/2017 17:10   (Echo, Carotid, EGD, Colonoscopy, ERCP)    Subjective:   Discharge Exam: Vitals:   09/30/17 2138 10/01/17 0559  BP: (!) 148/62 129/78  Pulse: 80 87  Resp: 16 16  Temp: 98.2 F (36.8 C) 98.3 F (36.8 C)  SpO2:  92% 93%   Vitals:   09/30/17 1433 09/30/17 1800 09/30/17 2138 10/01/17 0559  BP: 136/76  (!) 148/62 129/78  Pulse: 81  80 87  Resp: 15  16 16   Temp: 98.8 F (37.1 C)  98.2 F (36.8 C) 98.3 F (36.8 C)  TempSrc: Oral  Oral Oral  SpO2: 90% 91% 92% 93%  Weight:      Height:        General: Pt is alert, awake, not in acute distress Cardiovascular: RRR, S1/S2 +, no rubs, no gallops Respiratory: CTA bilaterally, no wheezing, no rhonchi Abdominal: Soft, NT, ND, bowel sounds + Extremities: no edema, no cyanosis    The results of significant  diagnostics from this hospitalization (including imaging, microbiology, ancillary and laboratory) are listed below for reference.     Microbiology: No results found for this or any previous visit (from the past 240 hour(s)).   Labs: BNP (last 3 results) Recent Labs    09/28/17 1401  BNP 11.5   Basic Metabolic Panel: Recent Labs  Lab 09/28/17 1401 09/29/17 0451  NA 141 142  K 3.2* 3.9  CL 104 106  CO2 26 27  GLUCOSE 112* 141*  BUN 12 14  CREATININE 0.93 0.80  CALCIUM 9.2 9.2   Liver Function Tests: No results for input(s): AST, ALT, ALKPHOS, BILITOT, PROT, ALBUMIN in the last 168 hours. No results for input(s): LIPASE, AMYLASE in the last 168 hours. No results for input(s): AMMONIA in the last 168 hours. CBC: Recent Labs  Lab 09/28/17 1401 09/29/17 0451  WBC 5.4 5.7  NEUTROABS 2.6  --   HGB 12.4 12.4  HCT 37.2 37.3  MCV 91.4 90.5  PLT 147* 217   Cardiac Enzymes: Recent Labs  Lab 09/28/17 1401 09/28/17 2229 09/29/17 0451 09/29/17 1003  TROPONINI <0.03 <0.03 <0.03 <0.03   BNP: Invalid input(s): POCBNP CBG: No results for input(s): GLUCAP in the last 168 hours. D-Dimer Recent Labs    09/28/17 1523  DDIMER 1.49*   Hgb A1c No results for input(s): HGBA1C in the last 72 hours. Lipid Profile No results for input(s): CHOL, HDL, LDLCALC, TRIG, CHOLHDL, LDLDIRECT in the last 72 hours. Thyroid function studies No results for input(s): TSH, T4TOTAL, T3FREE, THYROIDAB in the last 72 hours.  Invalid input(s): FREET3 Anemia work up No results for input(s): VITAMINB12, FOLATE, FERRITIN, TIBC, IRON, RETICCTPCT in the last 72 hours. Urinalysis No results found for: COLORURINE, APPEARANCEUR, LABSPEC, [place], GLUCOSEU, HGBUR, BILIRUBINUR, KETONESUR, PROTEINUR, UROBILINOGEN, NITRITE, LEUKOCYTESUR Sepsis Labs Invalid input(s): PROCALCITONIN,  WBC,  LACTICIDVEN Microbiology No results found for this or any previous visit (from the past 240 hour(s)).   Time  coordinating discharge: Over 30 minutes  SIGNED:   Marene Lenz, MD  [facility] 10/01/2017, 1:25 PM Pager [phone]  If 7PM-7AM, please contact night-coverage [email or web address] Password [email or web address]

## 2017-10-01 NOTE — Discharge Instructions (Signed)
Information on my medicine - ELIQUIS (apixaban)   Why was Eliquis prescribed for you? Eliquis was prescribed to treat blood clots that may have been found in the veins of your legs (deep vein thrombosis) or in your lungs (pulmonary embolism) and to reduce the risk of them occurring again.  What do You need to know about Eliquis ? The starting dose is 10 mg (two 5 mg tablets) taken TWICE daily for the FIRST SEVEN (7) DAYS, then on (enter date)  10/07/17  the dose is reduced to ONE 5 mg tablet taken TWICE daily.  Eliquis may be taken with or without food.   Try to take the dose about the same time in the morning and in the evening. If you have difficulty swallowing the tablet whole please discuss with your pharmacist how to take the medication safely.  Take Eliquis exactly as prescribed and DO NOT stop taking Eliquis without talking to the doctor who prescribed the medication.  Stopping may increase your risk of developing a new blood clot.  Refill your prescription before you run out.  After discharge, you should have regular check-up appointments with your healthcare provider that is prescribing your Eliquis.    What do you do if you miss a dose? If a dose of ELIQUIS is not taken at the scheduled time, take it as soon as possible on the same day and twice-daily administration should be resumed. The dose should not be doubled to make up for a missed dose.  Important Safety Information A possible side effect of Eliquis is bleeding. You should call your healthcare provider right away if you experience any of the following: ? Bleeding from an injury or your nose that does not stop. ? Unusual colored urine (red or dark brown) or unusual colored stools (red or black). ? Unusual bruising for unknown reasons. ? A serious fall or if you hit your head (even if there is no bleeding).  Some medicines may interact with Eliquis and might increase your risk of bleeding or clotting while on  Eliquis. To help avoid this, consult your healthcare provider or pharmacist prior to using any new prescription or non-prescription medications, including herbals, vitamins, non-steroidal anti-inflammatory drugs (NSAIDs) and supplements.  This website has more information on Eliquis (apixaban): http://www.eliquis.com/eliquis/home

## 2017-10-02 ENCOUNTER — Telehealth: Payer: Self-pay | Admitting: Family Medicine

## 2017-10-02 NOTE — Telephone Encounter (Signed)
I spoke with pt and she will be going back to [place], not here at our location, she had seen Dr. Martinique in the past, will remove as PCP.

## 2017-11-22 ENCOUNTER — Other Ambulatory Visit: Payer: Self-pay | Admitting: Obstetrics and Gynecology

## 2017-11-22 DIAGNOSIS — Z1231 Encounter for screening mammogram for malignant neoplasm of breast: Secondary | ICD-10-CM

## 2017-11-26 ENCOUNTER — Ambulatory Visit
Admission: RE | Admit: 2017-11-26 | Discharge: 2017-11-26 | Disposition: A | Discharge status: Home / Self Care | Payer: 59 | Source: Ambulatory Visit | Attending: Obstetrics and Gynecology | Admitting: Obstetrics and Gynecology

## 2017-11-26 DIAGNOSIS — Z1231 Encounter for screening mammogram for malignant neoplasm of breast: Secondary | ICD-10-CM

## 2017-11-28 ENCOUNTER — Other Ambulatory Visit: Payer: Self-pay | Admitting: Obstetrics and Gynecology

## 2017-11-28 DIAGNOSIS — R928 Other abnormal and inconclusive findings on diagnostic imaging of breast: Secondary | ICD-10-CM

## 2017-11-30 ENCOUNTER — Ambulatory Visit
Admission: RE | Admit: 2017-11-30 | Discharge: 2017-11-30 | Disposition: A | Discharge status: Home / Self Care | Payer: 59 | Source: Ambulatory Visit | Attending: Obstetrics and Gynecology | Admitting: Obstetrics and Gynecology

## 2017-11-30 DIAGNOSIS — R928 Other abnormal and inconclusive findings on diagnostic imaging of breast: Secondary | ICD-10-CM

## 2018-05-10 ENCOUNTER — Other Ambulatory Visit (HOSPITAL_BASED_OUTPATIENT_CLINIC_OR_DEPARTMENT_OTHER): Payer: Self-pay | Admitting: Family Medicine

## 2018-05-10 DIAGNOSIS — R911 Solitary pulmonary nodule: Secondary | ICD-10-CM

## 2018-05-20 ENCOUNTER — Ambulatory Visit (HOSPITAL_BASED_OUTPATIENT_CLINIC_OR_DEPARTMENT_OTHER)
Admission: RE | Admit: 2018-05-20 | Discharge: 2018-05-20 | Disposition: A | Discharge status: Home / Self Care | Payer: 59 | Source: Ambulatory Visit | Attending: Family Medicine | Admitting: Family Medicine

## 2018-05-20 DIAGNOSIS — R911 Solitary pulmonary nodule: Secondary | ICD-10-CM | POA: Diagnosis not present

## 2018-05-20 DIAGNOSIS — J439 Emphysema, unspecified: Secondary | ICD-10-CM | POA: Insufficient documentation

## 2018-05-20 MED ORDER — IOPAMIDOL (ISOVUE-300) INJECTION 61%
100.0000 mL | Freq: Once | INTRAVENOUS | Status: AC | PRN
  Administered 2018-05-20: 80 mL via INTRAVENOUS

## 2018-05-27 ENCOUNTER — Other Ambulatory Visit: Payer: Self-pay | Admitting: Gastroenterology

## 2018-05-27 DIAGNOSIS — R1310 Dysphagia, unspecified: Secondary | ICD-10-CM

## 2018-05-27 DIAGNOSIS — R1319 Other dysphagia: Secondary | ICD-10-CM

## 2018-05-28 ENCOUNTER — Ambulatory Visit
Admission: RE | Admit: 2018-05-28 | Discharge: 2018-05-28 | Disposition: A | Discharge status: Home / Self Care | Payer: 59 | Source: Ambulatory Visit | Attending: Gastroenterology | Admitting: Gastroenterology

## 2018-05-28 DIAGNOSIS — R1319 Other dysphagia: Secondary | ICD-10-CM

## 2018-05-28 DIAGNOSIS — R1310 Dysphagia, unspecified: Secondary | ICD-10-CM

## 2018-06-03 ENCOUNTER — Other Ambulatory Visit: Payer: Self-pay | Admitting: Family Medicine

## 2018-06-03 DIAGNOSIS — E041 Nontoxic single thyroid nodule: Secondary | ICD-10-CM

## 2018-06-06 ENCOUNTER — Ambulatory Visit
Admission: RE | Admit: 2018-06-06 | Discharge: 2018-06-06 | Disposition: A | Discharge status: Home / Self Care | Payer: 59 | Source: Ambulatory Visit | Attending: Family Medicine | Admitting: Family Medicine

## 2018-06-06 DIAGNOSIS — E041 Nontoxic single thyroid nodule: Secondary | ICD-10-CM

## 2018-08-22 ENCOUNTER — Emergency Department (HOSPITAL_BASED_OUTPATIENT_CLINIC_OR_DEPARTMENT_OTHER): Payer: Medicare Other

## 2018-08-22 ENCOUNTER — Other Ambulatory Visit: Payer: Self-pay

## 2018-08-22 ENCOUNTER — Encounter (HOSPITAL_BASED_OUTPATIENT_CLINIC_OR_DEPARTMENT_OTHER): Payer: Self-pay | Admitting: Emergency Medicine

## 2018-08-22 ENCOUNTER — Emergency Department (HOSPITAL_BASED_OUTPATIENT_CLINIC_OR_DEPARTMENT_OTHER)
Admission: EM | Admit: 2018-08-22 | Discharge: 2018-08-22 | Disposition: A | Discharge status: Home / Self Care | Payer: Medicare Other | Attending: Emergency Medicine | Admitting: Emergency Medicine

## 2018-08-22 DIAGNOSIS — Z7901 Long term (current) use of anticoagulants: Secondary | ICD-10-CM | POA: Insufficient documentation

## 2018-08-22 DIAGNOSIS — M79662 Pain in left lower leg: Secondary | ICD-10-CM | POA: Insufficient documentation

## 2018-08-22 DIAGNOSIS — Z79899 Other long term (current) drug therapy: Secondary | ICD-10-CM | POA: Insufficient documentation

## 2018-08-22 DIAGNOSIS — Z87891 Personal history of nicotine dependence: Secondary | ICD-10-CM | POA: Insufficient documentation

## 2018-08-22 DIAGNOSIS — M79605 Pain in left leg: Secondary | ICD-10-CM | POA: Diagnosis present

## 2018-08-22 HISTORY — DX: Other pulmonary embolism without acute cor pulmonale: I26.99

## 2018-08-22 NOTE — ED Provider Notes (Signed)
[facility] EMERGENCY DEPARTMENT Provider Note   CSN: [id number] Arrival date & time: 08/22/18  1104     History   Chief Complaint Chief Complaint  Patient presents with  . Leg Pain    HPI Christine Donaldson is a 63 y.o. female.  63yo F w/ PMH including PE on Eliquis who p/w L leg pain. She began noticing pain on outside of left lower leg 3 days ago that has progressively worsened. Feels better when she is walking around. No CP, SOB, trauma, numbness, weakness, fevers, or recent illness. She is compliant with Eliquis.  The history is provided by the patient.  Leg Pain    Past Medical History:  Diagnosis Date  . Elevated blood sugar   . Pulmonary embolism [facility])     Patient Active Problem List   Diagnosis Date Noted  . Hypoxia 09/28/2017  . Acute pulmonary embolism ([place]) 09/28/2017  . Pulmonary nodule 09/28/2017  . Thrombocytopenia ([place]) 09/28/2017  . Palpitations 02/25/2014    Past Surgical History:  Procedure Laterality Date  . BACK SURGERY     Lumbar     OB History   No obstetric history on file.      Home Medications    Prior to Admission medications   Medication Sig Start Date End Date Taking? Authorizing Provider  apixaban (ELIQUIS) 5 MG TABS tablet Take 2 tablets (10 mg total) by mouth 2 (two) times daily. 10/01/17   Shelly Coss, MD  apixaban (ELIQUIS) 5 MG TABS tablet Take 1 tablet (5 mg total) by mouth 2 (two) times daily. 10/07/17   Shelly Coss, MD  guaiFENesin (MUCINEX) 600 MG 12 hr tablet Take 1 tablet (600 mg total) by mouth 2 (two) times daily. 10/01/17   Shelly Coss, MD  KRILL OIL PO Take 750 mg by mouth. 600mg  daily     [provider]  Multiple Vitamin (MULTIVITAMIN) tablet Take 1 tablet by mouth daily.    [provider]  PROAIR RESPICLICK 710 (90 Base) MCG/ACT AEPB 2 puffs every 4 (four) hours. 09/21/17   [provider]  Probiotic Product (PROBIOTIC DAILY PO) Take by mouth.    [provider]  vitamin B-12 (CYANOCOBALAMIN) 1000 MCG tablet Take 1,000 mcg by mouth. liquiod  Also contains folic acid    [provider]  Vitamin D, Cholecalciferol, 1000 UNITS TABS Take by mouth. VITAMIN D # 3  2000ius daily    [provider]    Family History Family History  Problem Relation Age of Onset  . CAD Mother        Died age 66  . CAD Sister 66       Died at 2 of cirrhosis  . COPD Sister 35  . Breast cancer Neg Hx     Social History Social History   Tobacco Use  . Smoking status: Former Smoker    Packs/day: 1.00    Years: 32.00    Pack years: 32.00    Types: Cigarettes  . Smokeless tobacco: Never Used  . Tobacco comment: 8 years ago  Substance Use Topics  . Alcohol use: No    Frequency: Never  . Drug use: Not on file     Allergies   Sulfa antibiotics   Review of Systems Review of Systems All other systems reviewed and are negative except that which was mentioned in HPI   Physical Exam Updated Vital Signs BP (!) 157/86   Pulse 86   Temp 97.9 F (36.6  C) (Oral)   Resp 16   Ht 5\' 6"  (1.676 m)   Wt 81.6 kg   SpO2 98%   BMI 29.05 kg/m   Physical Exam Vitals signs and nursing note reviewed.  Constitutional:      General: She is not in acute distress.    Appearance: She is well-developed.  HENT:     Head: Normocephalic and atraumatic.     Nose: Nose normal.  Eyes:     Conjunctiva/sclera: Conjunctivae normal.  Neck:     Musculoskeletal: Neck supple.  Cardiovascular:     Pulses: Normal pulses.  Musculoskeletal:        General: No swelling or deformity.     Comments: Mild tenderness lateral to mid-tibia without swelling, skin changes, or induration  Skin:    General: Skin is warm and dry.     Capillary Refill: Capillary refill takes less than 2 seconds.  Neurological:     Mental Status: She is alert and oriented to person, place, and time.     Sensory: No sensory deficit.     Motor: No weakness.  Psychiatric:          Judgment: Judgment normal.      ED Treatments / Results  Labs (all labs ordered are listed, but only abnormal results are displayed) Labs Reviewed - No data to display  EKG None  Radiology No results found.  Procedures Procedures (including critical care time)  Medications Ordered in ED Medications - No data to display   Initial Impression / Assessment and Plan / ED Course  I have reviewed the triage vital signs and the nursing notes.  Pertinent imaging results that were available during my care of the patient were reviewed by me and considered in my medical decision making (see chart for details).     No signs of infection or vascular problem. XR negative for stress fx. [place] negative for DVT. Discussed supportive measures for musculoskeletal pain, reviewed return precautions.  Final Clinical Impressions(s) / ED Diagnoses   Final diagnoses:  None    ED Discharge Orders    None       , Wenda Overland, MD 08/22/18 [phone]

## 2018-08-22 NOTE — ED Triage Notes (Signed)
Pt c/o LLE pain from posterior knee down since Mon; hx of PE

## 2018-08-27 ENCOUNTER — Other Ambulatory Visit: Payer: Self-pay | Admitting: Physician Assistant

## 2018-08-27 ENCOUNTER — Ambulatory Visit
Admission: RE | Admit: 2018-08-27 | Discharge: 2018-08-27 | Disposition: A | Discharge status: Home / Self Care | Payer: Medicare Other | Source: Ambulatory Visit | Attending: Physician Assistant | Admitting: Physician Assistant

## 2018-08-27 DIAGNOSIS — M79605 Pain in left leg: Secondary | ICD-10-CM

## 2018-08-27 DIAGNOSIS — M791 Myalgia, unspecified site: Secondary | ICD-10-CM | POA: Diagnosis not present

## 2018-09-17 DIAGNOSIS — Z0000 Encounter for general adult medical examination without abnormal findings: Secondary | ICD-10-CM | POA: Diagnosis not present

## 2018-09-17 DIAGNOSIS — K219 Gastro-esophageal reflux disease without esophagitis: Secondary | ICD-10-CM | POA: Diagnosis not present

## 2018-09-17 DIAGNOSIS — E782 Mixed hyperlipidemia: Secondary | ICD-10-CM | POA: Diagnosis not present

## 2018-09-17 DIAGNOSIS — E041 Nontoxic single thyroid nodule: Secondary | ICD-10-CM | POA: Diagnosis not present

## 2018-09-17 DIAGNOSIS — J432 Centrilobular emphysema: Secondary | ICD-10-CM | POA: Diagnosis not present

## 2018-09-17 DIAGNOSIS — R911 Solitary pulmonary nodule: Secondary | ICD-10-CM | POA: Diagnosis not present

## 2018-09-17 DIAGNOSIS — I2510 Atherosclerotic heart disease of native coronary artery without angina pectoris: Secondary | ICD-10-CM | POA: Diagnosis not present

## 2018-09-17 DIAGNOSIS — Z13820 Encounter for screening for osteoporosis: Secondary | ICD-10-CM | POA: Diagnosis not present

## 2019-04-18 DIAGNOSIS — Z23 Encounter for immunization: Secondary | ICD-10-CM | POA: Diagnosis not present

## 2019-06-03 ENCOUNTER — Other Ambulatory Visit: Payer: Self-pay | Admitting: Obstetrics and Gynecology

## 2019-06-03 DIAGNOSIS — Z1231 Encounter for screening mammogram for malignant neoplasm of breast: Secondary | ICD-10-CM

## 2019-06-09 DIAGNOSIS — Z01419 Encounter for gynecological examination (general) (routine) without abnormal findings: Secondary | ICD-10-CM | POA: Diagnosis not present

## 2019-06-09 DIAGNOSIS — Z124 Encounter for screening for malignant neoplasm of cervix: Secondary | ICD-10-CM | POA: Diagnosis not present

## 2019-07-14 DIAGNOSIS — K219 Gastro-esophageal reflux disease without esophagitis: Secondary | ICD-10-CM | POA: Diagnosis not present

## 2019-07-23 ENCOUNTER — Ambulatory Visit
Admission: RE | Admit: 2019-07-23 | Discharge: 2019-07-23 | Disposition: A | Discharge status: Home / Self Care | Payer: Medicare Other | Source: Ambulatory Visit | Attending: Obstetrics and Gynecology | Admitting: Obstetrics and Gynecology

## 2019-07-23 ENCOUNTER — Other Ambulatory Visit: Payer: Self-pay

## 2019-07-23 DIAGNOSIS — Z1231 Encounter for screening mammogram for malignant neoplasm of breast: Secondary | ICD-10-CM | POA: Diagnosis not present

## 2019-07-24 ENCOUNTER — Other Ambulatory Visit: Payer: Self-pay | Admitting: Obstetrics and Gynecology

## 2019-07-24 DIAGNOSIS — R928 Other abnormal and inconclusive findings on diagnostic imaging of breast: Secondary | ICD-10-CM

## 2019-07-30 ENCOUNTER — Other Ambulatory Visit: Payer: Self-pay

## 2019-07-30 ENCOUNTER — Ambulatory Visit
Admission: RE | Admit: 2019-07-30 | Discharge: 2019-07-30 | Disposition: A | Discharge status: Home / Self Care | Payer: Medicare Other | Source: Ambulatory Visit | Attending: Obstetrics and Gynecology | Admitting: Obstetrics and Gynecology

## 2019-07-30 DIAGNOSIS — R922 Inconclusive mammogram: Secondary | ICD-10-CM | POA: Diagnosis not present

## 2019-07-30 DIAGNOSIS — N6012 Diffuse cystic mastopathy of left breast: Secondary | ICD-10-CM | POA: Diagnosis not present

## 2019-07-30 DIAGNOSIS — R928 Other abnormal and inconclusive findings on diagnostic imaging of breast: Secondary | ICD-10-CM

## 2019-09-05 DIAGNOSIS — Z23 Encounter for immunization: Secondary | ICD-10-CM | POA: Diagnosis not present

## 2019-09-30 DIAGNOSIS — Z03818 Encounter for observation for suspected exposure to other biological agents ruled out: Secondary | ICD-10-CM | POA: Diagnosis not present

## 2019-09-30 DIAGNOSIS — J019 Acute sinusitis, unspecified: Secondary | ICD-10-CM | POA: Diagnosis not present

## 2019-10-03 DIAGNOSIS — Z23 Encounter for immunization: Secondary | ICD-10-CM | POA: Diagnosis not present

## 2020-02-06 DIAGNOSIS — J432 Centrilobular emphysema: Secondary | ICD-10-CM | POA: Diagnosis not present

## 2020-02-06 DIAGNOSIS — Z86711 Personal history of pulmonary embolism: Secondary | ICD-10-CM | POA: Diagnosis not present

## 2020-02-06 DIAGNOSIS — Z7901 Long term (current) use of anticoagulants: Secondary | ICD-10-CM | POA: Diagnosis not present

## 2020-02-06 DIAGNOSIS — R944 Abnormal results of kidney function studies: Secondary | ICD-10-CM | POA: Diagnosis not present

## 2020-02-06 DIAGNOSIS — I2510 Atherosclerotic heart disease of native coronary artery without angina pectoris: Secondary | ICD-10-CM | POA: Diagnosis not present

## 2020-02-06 DIAGNOSIS — I700 Atherosclerosis of aorta: Secondary | ICD-10-CM | POA: Diagnosis not present

## 2020-03-16 DIAGNOSIS — H40033 Anatomical narrow angle, bilateral: Secondary | ICD-10-CM | POA: Diagnosis not present

## 2020-03-16 DIAGNOSIS — H2513 Age-related nuclear cataract, bilateral: Secondary | ICD-10-CM | POA: Diagnosis not present

## 2020-04-29 DIAGNOSIS — Z23 Encounter for immunization: Secondary | ICD-10-CM | POA: Diagnosis not present

## 2020-06-14 DIAGNOSIS — Z23 Encounter for immunization: Secondary | ICD-10-CM | POA: Diagnosis not present

## 2020-09-20 ENCOUNTER — Other Ambulatory Visit: Payer: Self-pay | Admitting: Obstetrics and Gynecology

## 2020-09-20 DIAGNOSIS — Z1231 Encounter for screening mammogram for malignant neoplasm of breast: Secondary | ICD-10-CM

## 2020-09-24 DIAGNOSIS — J069 Acute upper respiratory infection, unspecified: Secondary | ICD-10-CM | POA: Diagnosis not present

## 2020-09-29 ENCOUNTER — Ambulatory Visit
Admission: RE | Admit: 2020-09-29 | Discharge: 2020-09-29 | Disposition: A | Discharge status: Home / Self Care | Payer: Medicare Other | Source: Ambulatory Visit

## 2020-09-29 ENCOUNTER — Other Ambulatory Visit: Payer: Self-pay

## 2020-09-29 DIAGNOSIS — Z1231 Encounter for screening mammogram for malignant neoplasm of breast: Secondary | ICD-10-CM | POA: Diagnosis not present

## 2020-10-15 ENCOUNTER — Other Ambulatory Visit: Payer: Self-pay | Admitting: Physician Assistant

## 2020-10-15 DIAGNOSIS — Z86711 Personal history of pulmonary embolism: Secondary | ICD-10-CM | POA: Diagnosis not present

## 2020-10-15 DIAGNOSIS — R198 Other specified symptoms and signs involving the digestive system and abdomen: Secondary | ICD-10-CM

## 2020-10-15 DIAGNOSIS — K219 Gastro-esophageal reflux disease without esophagitis: Secondary | ICD-10-CM | POA: Diagnosis not present

## 2020-10-15 DIAGNOSIS — Z86010 Personal history of colonic polyps: Secondary | ICD-10-CM | POA: Diagnosis not present

## 2020-10-15 DIAGNOSIS — R0989 Other specified symptoms and signs involving the circulatory and respiratory systems: Secondary | ICD-10-CM

## 2020-10-19 ENCOUNTER — Other Ambulatory Visit: Payer: Self-pay | Admitting: Physician Assistant

## 2020-10-19 ENCOUNTER — Ambulatory Visit
Admission: RE | Admit: 2020-10-19 | Discharge: 2020-10-19 | Disposition: A | Discharge status: Home / Self Care | Payer: Medicare Other | Source: Ambulatory Visit | Attending: Physician Assistant | Admitting: Physician Assistant

## 2020-10-19 DIAGNOSIS — K224 Dyskinesia of esophagus: Secondary | ICD-10-CM | POA: Diagnosis not present

## 2020-10-19 DIAGNOSIS — R0989 Other specified symptoms and signs involving the circulatory and respiratory systems: Secondary | ICD-10-CM

## 2020-10-19 DIAGNOSIS — R198 Other specified symptoms and signs involving the digestive system and abdomen: Secondary | ICD-10-CM

## 2020-11-18 DIAGNOSIS — E782 Mixed hyperlipidemia: Secondary | ICD-10-CM | POA: Diagnosis not present

## 2020-11-18 DIAGNOSIS — Z7901 Long term (current) use of anticoagulants: Secondary | ICD-10-CM | POA: Diagnosis not present

## 2020-11-18 DIAGNOSIS — J432 Centrilobular emphysema: Secondary | ICD-10-CM | POA: Diagnosis not present

## 2020-11-18 DIAGNOSIS — Z0000 Encounter for general adult medical examination without abnormal findings: Secondary | ICD-10-CM | POA: Diagnosis not present

## 2020-11-18 DIAGNOSIS — I700 Atherosclerosis of aorta: Secondary | ICD-10-CM | POA: Diagnosis not present

## 2020-11-18 DIAGNOSIS — I2510 Atherosclerotic heart disease of native coronary artery without angina pectoris: Secondary | ICD-10-CM | POA: Diagnosis not present

## 2020-11-18 DIAGNOSIS — Z86711 Personal history of pulmonary embolism: Secondary | ICD-10-CM | POA: Diagnosis not present

## 2020-11-22 DIAGNOSIS — Z7901 Long term (current) use of anticoagulants: Secondary | ICD-10-CM | POA: Diagnosis not present

## 2020-11-22 DIAGNOSIS — I2510 Atherosclerotic heart disease of native coronary artery without angina pectoris: Secondary | ICD-10-CM | POA: Diagnosis not present

## 2020-11-22 DIAGNOSIS — J432 Centrilobular emphysema: Secondary | ICD-10-CM | POA: Diagnosis not present

## 2020-11-22 DIAGNOSIS — E782 Mixed hyperlipidemia: Secondary | ICD-10-CM | POA: Diagnosis not present

## 2020-11-22 DIAGNOSIS — Z0000 Encounter for general adult medical examination without abnormal findings: Secondary | ICD-10-CM | POA: Diagnosis not present

## 2020-11-22 DIAGNOSIS — I700 Atherosclerosis of aorta: Secondary | ICD-10-CM | POA: Diagnosis not present

## 2020-11-22 DIAGNOSIS — Z136 Encounter for screening for cardiovascular disorders: Secondary | ICD-10-CM | POA: Diagnosis not present

## 2020-11-22 DIAGNOSIS — Z86711 Personal history of pulmonary embolism: Secondary | ICD-10-CM | POA: Diagnosis not present

## 2021-03-22 DIAGNOSIS — H40033 Anatomical narrow angle, bilateral: Secondary | ICD-10-CM | POA: Diagnosis not present

## 2021-03-22 DIAGNOSIS — H2513 Age-related nuclear cataract, bilateral: Secondary | ICD-10-CM | POA: Diagnosis not present

## 2021-03-31 DIAGNOSIS — K635 Polyp of colon: Secondary | ICD-10-CM | POA: Diagnosis not present

## 2021-03-31 DIAGNOSIS — K621 Rectal polyp: Secondary | ICD-10-CM | POA: Diagnosis not present

## 2021-03-31 DIAGNOSIS — Z86010 Personal history of colonic polyps: Secondary | ICD-10-CM | POA: Diagnosis not present

## 2021-03-31 DIAGNOSIS — D122 Benign neoplasm of ascending colon: Secondary | ICD-10-CM | POA: Diagnosis not present

## 2021-04-04 DIAGNOSIS — K621 Rectal polyp: Secondary | ICD-10-CM | POA: Diagnosis not present

## 2021-04-04 DIAGNOSIS — D122 Benign neoplasm of ascending colon: Secondary | ICD-10-CM | POA: Diagnosis not present

## 2021-04-04 DIAGNOSIS — K635 Polyp of colon: Secondary | ICD-10-CM | POA: Diagnosis not present

## 2021-04-25 DIAGNOSIS — Z23 Encounter for immunization: Secondary | ICD-10-CM | POA: Diagnosis not present

## 2021-05-10 DIAGNOSIS — R0609 Other forms of dyspnea: Secondary | ICD-10-CM | POA: Diagnosis not present

## 2021-05-10 DIAGNOSIS — R198 Other specified symptoms and signs involving the digestive system and abdomen: Secondary | ICD-10-CM | POA: Diagnosis not present

## 2021-05-10 DIAGNOSIS — K219 Gastro-esophageal reflux disease without esophagitis: Secondary | ICD-10-CM | POA: Diagnosis not present

## 2021-05-30 DIAGNOSIS — J069 Acute upper respiratory infection, unspecified: Secondary | ICD-10-CM | POA: Diagnosis not present

## 2021-10-04 DIAGNOSIS — D485 Neoplasm of uncertain behavior of skin: Secondary | ICD-10-CM | POA: Diagnosis not present

## 2021-10-04 DIAGNOSIS — C44719 Basal cell carcinoma of skin of left lower limb, including hip: Secondary | ICD-10-CM | POA: Diagnosis not present

## 2021-10-19 NOTE — Progress Notes (Signed)
?  ?Cardiology Office Note ? ? ?Date:  10/20/2021  ? ?ID:  Christine Donaldson, DOB Aug 12, 1955, MRN [id number] ? ?PCP:  Aurea Graff, PA-C (Inactive)  ?Cardiologist:   None ?Referring:  Aurea Graff, PA-C (Inactive) ? ?Chief Complaint  ?Patient presents with  ? Shortness of Breath  ? ? ?  ?History of Present Illness: ?Christine Donaldson is a 66 y.o. female who presents for evaluation of shortness of breath.I saw her in 2015.   This was for evaluation of palpitations.  She had a strong family history of CAD and had a negative POET (Plain Old Exercise Treadmill) .  Echo in 2019 demonstrated an EF of 55%.  In 2019 this was done because she had pulmonary embolism.  There was no etiology for this.  She did have a CT and I saw that she had some coronary calcification noted and aortic atherosclerosis noted. ? ?She presents now because she is having episodes of shortness of breath.  She is having these episodes where her heart rate goes fast a couple of times just sitting there and it is recorded on her watch but she does not particularly notice this.  The shortness of breath is happening more with activity.  That happens with mild activity.  She recovers quickly.  She is not describing PND or orthopnea.  She is not having any presyncope or syncope.  She is not having any chest pressure, neck or arm discomfort. ? ? ?Past Medical History:  ?Diagnosis Date  ? Elevated blood sugar   ? Pulmonary embolism ([place])   ? ? ?Past Surgical History:  ?Procedure Laterality Date  ? BACK SURGERY    ? Lumbar  ? ? ? ?Current Outpatient Medications  ?Medication Sig Dispense Refill  ? apixaban (ELIQUIS) 5 MG TABS tablet Take 1 tablet (5 mg total) by mouth 2 (two) times daily. 60 tablet 0  ? fluticasone (FLONASE) 50 MCG/ACT nasal spray daily in the afternoon.    ? KRILL OIL PO Take 750 mg by mouth. '600mg'$  daily     ? Loratadine 10 MG CAPS Take 10 mg by mouth daily.    ? metoprolol tartrate (LOPRESSOR) 100 MG tablet Take one tablet two hours  prior to the test 1 tablet 0  ? Multiple Vitamin (MULTIVITAMIN) tablet Take 1 tablet by mouth daily.    ? pantoprazole (PROTONIX) 40 MG tablet Take 40 mg by mouth daily.    ? vitamin B-12 (CYANOCOBALAMIN) 1000 MCG tablet Take 1,000 mcg by mouth. liquiod  Also contains folic acid    ? Vitamin D, Cholecalciferol, 1000 UNITS TABS Take by mouth. VITAMIN D # 3  2000ius daily    ? apixaban (ELIQUIS) 5 MG TABS tablet Take 2 tablets (10 mg total) by mouth 2 (two) times daily. (Patient not taking: Reported on 10/20/2021) 24 tablet 0  ? guaiFENesin (MUCINEX) 600 MG 12 hr tablet Take 1 tablet (600 mg total) by mouth 2 (two) times daily. 10 tablet 0  ? PROAIR RESPICLICK 836 (90 Base) MCG/ACT AEPB 2 puffs every 4 (four) hours.  0  ? Probiotic Product (PROBIOTIC DAILY PO) Take by mouth.    ? ?No current facility-administered medications for this visit.  ? ? ?Allergies:   Sulfa antibiotics  ? ? ?Social History:  The patient  reports that she has quit smoking. Her smoking use included cigarettes. She has a 32.00 pack-year smoking history. She has never used smokeless tobacco. She reports that she does not drink alcohol.  ? ?  Family History:  The patient's family history includes CAD in her mother; CAD (age of onset: 92) in her sister; COPD (age of onset: 67) in her sister.  ? ? ?ROS:  Please see the history of present illness.   Otherwise, review of systems are positive for none.   All other systems are reviewed and negative.  ? ? ?PHYSICAL EXAM: ?VS:  BP (!) 144/82   Pulse 83   Ht '5\' 6"'$  (1.676 m)   Wt 176 lb 12.8 oz (80.2 kg)   SpO2 92%   BMI 28.54 kg/m?  , BMI Body mass index is 28.54 kg/m?. ?GENERAL:  Well appearing ?HEENT:  Pupils equal round and reactive, fundi not visualized, oral mucosa unremarkable ?NECK:  No jugular venous distention, waveform within normal limits, carotid upstroke brisk and symmetric, no bruits, no thyromegaly ?LYMPHATICS:  No cervical, inguinal adenopathy ?LUNGS:  Clear to auscultation  bilaterally ?BACK:  No CVA tenderness ?CHEST:  Unremarkable ?HEART:  PMI not displaced or sustained,S1 and S2 within normal limits, no S3, no S4, no clicks, no rubs, no murmurs ?ABD:  Flat, positive bowel sounds normal in frequency in pitch, no bruits, no rebound, no guarding, no midline pulsatile mass, no hepatomegaly, no splenomegaly ?EXT:  2 plus pulses throughout, no edema, no cyanosis no clubbing ?SKIN:  No rashes no nodules ?NEURO:  Cranial nerves II through XII grossly intact, motor grossly intact throughout ?PSYCH:  Cognitively intact, oriented to person place and time ? ? ? ?EKG:  EKG is ordered today. ?The ekg ordered today demonstrates sinus rhythm, rate 83, axis within normal limits, intervals within normal limits, no acute ST-T wave changes. ? ? ?Recent Labs: ?No results found for requested labs within last 8760 hours.  ? ? ?Lipid Panel ?No results found for: CHOL, TRIG, HDL, CHOLHDL, VLDL, LDLCALC, LDLDIRECT ?  ? ?Wt Readings from Last 3 Encounters:  ?10/20/21 176 lb 12.8 oz (80.2 kg)  ?08/22/18 180 lb (81.6 kg)  ?09/28/17 170 lb (77.1 kg)  ?  ? ? ?Other studies Reviewed: ?Additional studies/ records that were reviewed today include: CT 2019. ?Review of the above records demonstrates:  Please see elsewhere in the note.   ? ? ?ASSESSMENT AND PLAN: ? ?SOB: Because of her family history, known mild coronary calcium, risk factors otherwise I have a high pretest probability that she could have obstructive coronary disease.  She will need a coronary CTA.  Further evaluation will be based on this. ? ?PE: She has a strong family history of early coronary events in her mom and sister and in fact her father had some kind of a clotting issue.  I am going to send her to hematology to consider hypercoagulable work-up.  For now she will remain on the current dose of Eliquis. ? ?Dyslipidemia: Her most recent LDL was 137.  Goals of therapy will be based on findings on the CT.  I am going to be suggesting however a  lower goal for her LDL most likely.  She would consider a statin at this point. ? ?Tachycardia: She has some asymptomatic tachypalpitations.  These are not particularly bothering her at this point.  I will consider further monitoring in the future. ? ? ?Current medicines are reviewed at length with the patient today.  The patient does not have concerns regarding medicines. ? ?The following changes have been made:  no change ? ?Labs/ tests ordered today include:  ? ?Orders Placed This Encounter  ?Procedures  ? CT CORONARY MORPH W/CTA COR  W/SCORE W/CA W/CM &/OR WO/CM  ? Basic metabolic panel  ? Ambulatory referral to Hematology / Oncology  ? EKG 12-Lead  ? ? ? ?Disposition:   FU with me after the heme visit and CT.   ? ? ?Signed, ?Minus Breeding, MD  ?10/20/2021 10:02 AM    ?[place] ? ? ? ?

## 2021-10-20 ENCOUNTER — Ambulatory Visit (INDEPENDENT_AMBULATORY_CARE_PROVIDER_SITE_OTHER): Payer: Medicare Other | Admitting: Cardiology

## 2021-10-20 ENCOUNTER — Encounter: Payer: Self-pay | Admitting: Cardiology

## 2021-10-20 VITALS — BP 144/82 | HR 83 | Ht 66.0 in | Wt 176.8 lb

## 2021-10-20 DIAGNOSIS — I2510 Atherosclerotic heart disease of native coronary artery without angina pectoris: Secondary | ICD-10-CM | POA: Diagnosis not present

## 2021-10-20 DIAGNOSIS — D689 Coagulation defect, unspecified: Secondary | ICD-10-CM

## 2021-10-20 LAB — BASIC METABOLIC PANEL
BUN/Creatinine Ratio: 11 — ABNORMAL LOW (ref 12–28)
BUN: 10 mg/dL (ref 8–27)
CO2: 27 mmol/L (ref 20–29)
Calcium: 9.8 mg/dL (ref 8.7–10.3)
Chloride: 104 mmol/L (ref 96–106)
Creatinine, Ser: 0.91 mg/dL (ref 0.57–1.00)
Glucose: 97 mg/dL (ref 70–99)
Potassium: 4.5 mmol/L (ref 3.5–5.2)
Sodium: 143 mmol/L (ref 134–144)
eGFR: 69 mL/min/{1.73_m2} (ref >59)

## 2021-10-20 MED ORDER — METOPROLOL TARTRATE 100 MG PO TABS: ORAL_TABLET | ORAL | 0 refills | Status: DC

## 2021-10-20 NOTE — Patient Instructions (Addendum)
Medication Instructions:  ?No changes ?*If you need a refill on your cardiac medications before your next appointment, please call your pharmacy* ? ? ?Lab Work: ?Labs to be done prior to the Coronary CT ?If you have labs (blood work) drawn today and your tests are completely normal, you will receive your results only by: ?MyChart Message (if you have MyChart) OR ?A paper copy in the mail ?If you have any lab test that is abnormal or we need to change your treatment, we will call you to review the results. ? ?Follow-Up: ?At [facility], you and your health needs are our priority.  As part of our continuing mission to provide you with exceptional heart care, we have created designated Provider Care Teams.  These Care Teams include your primary Cardiologist (physician) and Advanced Practice Providers (APPs -  Physician Assistants and Nurse Practitioners) who all work together to provide you with the care you need, when you need it. ? ?We recommend signing up for the patient portal called "MyChart".  Sign up information is provided on this After Visit Summary.  MyChart is used to connect with patients for Virtual Visits (Telemedicine).  Patients are able to view lab/test results, encounter notes, upcoming appointments, etc.  Non-urgent messages can be sent to your provider as well.   ?To learn more about what you can do with MyChart, go to [email or web address].   ? ?Your next appointment:   ?8 week(s) ? ?The format for your next appointment:   ?In Person ? ?Provider:   ?Dr. Percival Spanish ? ?A referral has been made to Hematology. They will call you to set this up. ? ?Other Instructions ? ? ?Your cardiac CT will be scheduled at one of the below locations:  ? ?[facility] ?[address] ?[place], [place] [address] ?[phone] ? ?OR ? ?[facility] ?[place] ?[address] ?[place], [place] [address] ?[phone] ? ?If scheduled at [facility], please arrive at  the [facility]'s Entrance (Entrance C2) of [facility] 30 minutes prior to test start time. ?You can use the FREE valet parking offered at entrance C (encouraged to control the heart rate for the test)  ?Proceed to the [facility] Radiology Department (first floor) to check-in and test prep. ? ?All radiology patients and guests should use entrance C2 at [facility], accessed from [facility], even though the hospital's physical address listed is [address]. ? ? ? ?If scheduled at [facility], please arrive 15 mins early for check-in and test prep. ? ?Please follow these instructions carefully (unless otherwise directed): ? ?On the Night Before the Test: ?Be sure to Drink plenty of water. ?Do not consume any caffeinated/decaffeinated beverages or chocolate 12 hours prior to your test. ?Do not take any antihistamines 12 hours prior to your test. ? ?On the Day of the Test: ?Drink plenty of water until 1 hour prior to the test. ?Do not eat any food 4 hours prior to the test. ?You may take your regular medications prior to the test.  ?Take metoprolol (Lopressor) two hours prior to test. ?FEMALES- please wear underwire-free bra if available, avoid dresses & tight clothing ? ?     ?After the Test: ?Drink plenty of water. ?After receiving IV contrast, you may experience a mild flushed feeling. This is normal. ?On occasion, you may experience a mild rash up to 24 hours after the test. This is not dangerous. If this  occurs, you can take Benadryl 25 mg and increase your fluid intake. ?If you experience trouble breathing, this can be serious. If it is severe call 911 IMMEDIATELY. If it is mild, please call our office. ?If you take any of these medications: Glipizide/Metformin, Avandament, Glucavance, please do not take 48 hours after completing test unless otherwise instructed. ? ?We will call to schedule your test 2-4 weeks out understanding that some  insurance companies will need an authorization prior to the service being performed.  ? ?For non-scheduling related questions, please contact the cardiac imaging nurse navigator should you have any questions/concerns: ?Marchia Bond, Cardiac Imaging Nurse Navigator ?Gordy Clement, Cardiac Imaging Nurse Navigator ?[facility] ?Direct Office Dial: [phone]  ? ?For scheduling needs, including cancellations and rescheduling, please call [place], [phone]. ? ? ?

## 2021-11-01 ENCOUNTER — Other Ambulatory Visit: Payer: Self-pay | Admitting: *Deleted

## 2021-11-01 DIAGNOSIS — L815 Leukoderma, not elsewhere classified: Secondary | ICD-10-CM | POA: Diagnosis not present

## 2021-11-01 DIAGNOSIS — D1801 Hemangioma of skin and subcutaneous tissue: Secondary | ICD-10-CM | POA: Diagnosis not present

## 2021-11-01 DIAGNOSIS — I2699 Other pulmonary embolism without acute cor pulmonale: Secondary | ICD-10-CM

## 2021-11-01 DIAGNOSIS — L821 Other seborrheic keratosis: Secondary | ICD-10-CM | POA: Diagnosis not present

## 2021-11-01 DIAGNOSIS — L578 Other skin changes due to chronic exposure to nonionizing radiation: Secondary | ICD-10-CM | POA: Diagnosis not present

## 2021-11-01 DIAGNOSIS — C44719 Basal cell carcinoma of skin of left lower limb, including hip: Secondary | ICD-10-CM | POA: Diagnosis not present

## 2021-11-01 DIAGNOSIS — D225 Melanocytic nevi of trunk: Secondary | ICD-10-CM | POA: Diagnosis not present

## 2021-11-01 DIAGNOSIS — D2272 Melanocytic nevi of left lower limb, including hip: Secondary | ICD-10-CM | POA: Diagnosis not present

## 2021-11-01 DIAGNOSIS — L570 Actinic keratosis: Secondary | ICD-10-CM | POA: Diagnosis not present

## 2021-11-01 NOTE — Progress Notes (Signed)
New Hem appt and lab orders entered ? ?

## 2021-11-03 ENCOUNTER — Telehealth (HOSPITAL_COMMUNITY): Payer: Self-pay | Admitting: *Deleted

## 2021-11-03 NOTE — Telephone Encounter (Signed)
Reaching out to patient to offer assistance regarding upcoming cardiac imaging study; pt verbalizes understanding of appt date/time, parking situation and where to check in, pre-test NPO status and medications ordered, and verified current allergies; name and call back number provided for further questions should they arise ? ?Gordy Clement RN Navigator Cardiac Imaging ?[facility] ?[phone] office ?[phone] cell ? ?Patient to take '100mg'$  metoprolol tartrate two hours prior to cardiac CT scan. She is aware to arrive at 10:30am. ?

## 2021-11-04 ENCOUNTER — Ambulatory Visit (HOSPITAL_COMMUNITY)
Admission: RE | Admit: 2021-11-04 | Discharge: 2021-11-04 | Disposition: A | Discharge status: Home / Self Care | Payer: Medicare Other | Source: Ambulatory Visit | Attending: Cardiology | Admitting: Cardiology

## 2021-11-04 DIAGNOSIS — I2510 Atherosclerotic heart disease of native coronary artery without angina pectoris: Secondary | ICD-10-CM

## 2021-11-04 MED ORDER — NITROGLYCERIN 0.4 MG SL SUBL
SUBLINGUAL_TABLET | SUBLINGUAL | Status: AC
  Filled 2021-11-04: qty 2

## 2021-11-04 MED ORDER — NITROGLYCERIN 0.4 MG SL SUBL
0.8000 mg | SUBLINGUAL_TABLET | Freq: Once | SUBLINGUAL | Status: AC
  Administered 2021-11-04: 0.8 mg via SUBLINGUAL

## 2021-11-04 MED ORDER — IOHEXOL 350 MG/ML SOLN
100.0000 mL | Freq: Once | INTRAVENOUS | Status: AC | PRN
  Administered 2021-11-04: 100 mL via INTRAVENOUS

## 2021-11-07 ENCOUNTER — Inpatient Hospital Stay: Payer: Medicare Other | Attending: Oncology

## 2021-11-07 ENCOUNTER — Telehealth: Payer: Self-pay | Admitting: *Deleted

## 2021-11-07 DIAGNOSIS — K219 Gastro-esophageal reflux disease without esophagitis: Secondary | ICD-10-CM | POA: Diagnosis not present

## 2021-11-07 DIAGNOSIS — I2699 Other pulmonary embolism without acute cor pulmonale: Secondary | ICD-10-CM | POA: Diagnosis not present

## 2021-11-07 DIAGNOSIS — J439 Emphysema, unspecified: Secondary | ICD-10-CM | POA: Diagnosis not present

## 2021-11-07 DIAGNOSIS — I2510 Atherosclerotic heart disease of native coronary artery without angina pectoris: Secondary | ICD-10-CM

## 2021-11-07 DIAGNOSIS — Z87891 Personal history of nicotine dependence: Secondary | ICD-10-CM | POA: Insufficient documentation

## 2021-11-07 LAB — CBC WITH DIFFERENTIAL (CANCER CENTER ONLY)
Abs Immature Granulocytes: 0.01 10*3/uL (ref 0.00–0.07)
Basophils Absolute: 0 10*3/uL (ref 0.0–0.1)
Basophils Relative: 1 %
Eosinophils Absolute: 0.1 10*3/uL (ref 0.0–0.5)
Eosinophils Relative: 3 %
HCT: 37.2 % (ref 36.0–46.0)
Hemoglobin: 12.4 g/dL (ref 12.0–15.0)
Immature Granulocytes: 0 %
Lymphocytes Relative: 29 %
Lymphs Abs: 1.1 10*3/uL (ref 0.7–4.0)
MCH: 30 pg (ref 26.0–34.0)
MCHC: 33.3 g/dL (ref 30.0–36.0)
MCV: 89.9 fL (ref 80.0–100.0)
Monocytes Absolute: 0.3 10*3/uL (ref 0.1–1.0)
Monocytes Relative: 8 %
Neutro Abs: 2.2 10*3/uL (ref 1.7–7.7)
Neutrophils Relative %: 59 %
Platelet Count: 157 10*3/uL (ref 150–400)
RBC: 4.14 MIL/uL (ref 3.87–5.11)
RDW: 12.8 % (ref 11.5–15.5)
WBC Count: 3.8 10*3/uL — ABNORMAL LOW (ref 4.0–10.5)
nRBC: 0 % (ref 0.0–0.2)

## 2021-11-07 LAB — SAVE SMEAR(SSMR), FOR PROVIDER SLIDE REVIEW

## 2021-11-07 MED ORDER — ROSUVASTATIN CALCIUM 20 MG PO TABS: 20.0000 mg | ORAL_TABLET | Freq: Every day | ORAL | 3 refills | Status: AC

## 2021-11-07 NOTE — Telephone Encounter (Signed)
-----   Message from Minus Breeding, MD sent at 11/05/2021 11:54 AM EDT ----- ?This is non obstructive coronary plaque.  However, I would suggest lipid management and I would start with 20 mg of Crestor and repeat lipid profile in 10 weeks.  Call Ms. Folz with the results and send results to Aurea Graff, PA-C (Inactive) ? ?

## 2021-11-07 NOTE — Telephone Encounter (Signed)
pt aware of results  ?New script sent to the pharmacy  ?Lab orders mailed to the pt  ?Results forwarded  ?

## 2021-11-08 ENCOUNTER — Inpatient Hospital Stay: Payer: Medicare Other

## 2021-11-08 ENCOUNTER — Other Ambulatory Visit: Payer: Self-pay | Admitting: Family Medicine

## 2021-11-08 ENCOUNTER — Inpatient Hospital Stay (HOSPITAL_BASED_OUTPATIENT_CLINIC_OR_DEPARTMENT_OTHER): Payer: Medicare Other | Admitting: Oncology

## 2021-11-08 VITALS — BP 160/76 | HR 89 | Temp 97.8°F | Resp 18 | Ht 66.0 in | Wt 175.4 lb

## 2021-11-08 DIAGNOSIS — I2699 Other pulmonary embolism without acute cor pulmonale: Secondary | ICD-10-CM

## 2021-11-08 DIAGNOSIS — Z1231 Encounter for screening mammogram for malignant neoplasm of breast: Secondary | ICD-10-CM

## 2021-11-08 DIAGNOSIS — J439 Emphysema, unspecified: Secondary | ICD-10-CM | POA: Diagnosis not present

## 2021-11-08 DIAGNOSIS — K219 Gastro-esophageal reflux disease without esophagitis: Secondary | ICD-10-CM

## 2021-11-08 DIAGNOSIS — Z87891 Personal history of nicotine dependence: Secondary | ICD-10-CM

## 2021-11-08 DIAGNOSIS — J449 Chronic obstructive pulmonary disease, unspecified: Secondary | ICD-10-CM

## 2021-11-08 LAB — ANTITHROMBIN III: AntiThromb III Func: 127 % — ABNORMAL HIGH (ref 75–120)

## 2021-11-08 NOTE — Progress Notes (Signed)
[name] ?New Patient Consult ? ? ?Requesting MD: ?Minus Breeding, [place] ?[address] ?[address] ?[place],  [place] [address] ? ? ?Christine Donaldson ?66 y.o.  ?02/08/1956 ? ?  ?Reason for Consult: Pulmonary embolism ? ? ?HPI: [name] presented to the emergency room with dyspnea in March 2019.  The D-dimer was elevated and a CT of the chest revealed acute segmental pulmonary emboli in the right upper lobe.  There were changes of emphysema and a 4.5 mm lingula nodule. ? ?She was admitted and placed on Lovenox.  She was transition to apixaban at discharge.  She continues apixaban.  No bleeding or symptom of recurrent thrombosis.  She had no previous history of venous or arterial thrombosis.  She has a history of first trimester miscarriages. ? ?Her sister had a myocardial infarction at age 7.  Her mother had a myocardial infarction at age 63.  Her daughter has Crohn's disease and was diagnosed with venous thrombosis involving the IVC, portal, splenic, and SMV.  Her daughter underwent a hypercoagulation panel, she is not sure of the results. ? ?Bilateral lower extremity Dopplers 09/30/2017 were negative for DVT.  A left lower extremity Doppler on 08/22/2018 was negative for DVT. ? ?Past Medical History:  ?Diagnosis Date  ? Elevated blood sugar   ? Pulmonary embolism [facility]) March 2019  ?  Marland Kitchen  Gastroesophageal reflux disease ?  .  Basal cell carcinoma of the left leg scheduled to be removed for 2023 ?  Marland Kitchen  G4 P2, 2 miscarriages at approximately 2 months ? ?Past Surgical History:  ?Procedure Laterality Date  ? BACK SURGERY  1984  ? Lumbar  ?  .  Bilateral tubal ligation ? ?Medications: Reviewed ? ?Allergies:  ?Allergies  ?Allergen Reactions  ? Sulfa Antibiotics Hives  ? ? ?Family history: Her father had a "clot "in the abdomen.  Her daughter had a pulmonary embolism.  She has a history of Crohn's disease.  She also had venous thrombosis involving the IVC, portal, splenic, and SMV.  A sister had a  myocardial infarction at age 67.  Her mother had a myocardial infarction at age 33.  No other family history of venous or arterial thromboembolic disease. ? ?Social History:  ? ?She lives with her husband in [place].  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  She previously worked in Engineer, maintenance (IT).  She quit smoking cigarettes in 2007.  No alcohol use.  No transfusion history.  No risk factor for HIV or hepatitis. ? ?ROS:  ? ?Positives include: Dysphagia-improved with pantoprazole, exertional dyspnea, chronic numbness in the bilateral great toe ? ?A complete ROS was otherwise negative. ? ?Physical Exam: ? ?Blood pressure (!) 160/76, pulse 89, temperature 97.8 ?F (36.6 ?C), temperature source Oral, resp. rate 18, height '5\' 6"'$  (1.676 m), weight 175 lb 6.4 oz (79.6 kg), SpO2 98 %. ? ?Lungs: Distant breath sounds, no respiratory distress ?Cardiac: Regular rate and rhythm ?Abdomen: No hepatosplenomegaly  ?Vascular: No leg edema ?Lymph nodes: No cervical, supraclavicular, axillary, or inguinal nodes ?Neurologic: Alert and oriented, the motor exam appears intact in the upper and lower extremities bilaterally ?Musculoskeletal: No spine tenderness ? ? ?LAB: ? ?CBC ? ?Lab Results  ?Component Value Date  ? WBC 3.8 (L) 11/07/2021  ? HGB 12.4 11/07/2021  ? HCT 37.2 11/07/2021  ? MCV 89.9 11/07/2021  ? PLT 157 11/07/2021  ? NEUTROABS 2.2 11/07/2021  ?  ? ?  ? ?CMP  ?Lab Results  ?Component Value Date  ?  NA 143 10/20/2021  ? K 4.5 10/20/2021  ? CL 104 10/20/2021  ? CO2 27 10/20/2021  ? GLUCOSE 97 10/20/2021  ? BUN 10 10/20/2021  ? CREATININE 0.91 10/20/2021  ? CALCIUM 9.8 10/20/2021  ? GFRNONAA >60 09/29/2017  ? GFRAA >60 09/29/2017  ? ? ? ?Imaging: CT 09/28/2017-images reviewed ? ? ? ?Assessment/Plan:  ? ?Pulmonary embolism 09/28/2017 ?Negative bilateral lower extremity Dopplers 09/30/2017 ?Admission 09/28/2017-discharged 10/01/2017, Lovenox anticoagulation on admission, converted to apixaban prior to discharge ?Family history of venous and  arterial thromboembolic disease ?Daughter with Crohn's disease and history of pulmonary embolism and portal/mesenteric thrombosis ?Sister and mother with myocardial infarction ? ?3.   COPD ?4.   History of tobacco use ?5.   Gastroesophageal reflux disease ?6.   History of miscarriage ? ? ?Disposition:  ? ?[name] was diagnosed with an unprovoked pulmonary embolism in 2019.  She has been maintained on apixaban anticoagulation since 2019. ? ?She has no apparent risk factor for venous thromboembolic disease.  She has a family history of venous and arterial thrombosis. ? ?I agree with the current plan for indefinite anticoagulation therapy. ? ?We discussed inherited and acquired reasons which may predispose to venous thromboembolism.  She will return to the lab for a hypercoagulable panel today. ? ?She will return for an office visit in 4-6 weeks to review the hypercoagulation panel.  We will decide on continuing full dose versus reduced intensity anticoagulation.  There is data supporting the use of lower dose apixaban in patients who have completed a defined course of full dose anticoagulation. ? ? ? ?Christine Coder, MD  ?11/08/2021, 4:31 PM ? ? ?

## 2021-11-10 DIAGNOSIS — L905 Scar conditions and fibrosis of skin: Secondary | ICD-10-CM | POA: Diagnosis not present

## 2021-11-10 DIAGNOSIS — C44719 Basal cell carcinoma of skin of left lower limb, including hip: Secondary | ICD-10-CM | POA: Diagnosis not present

## 2021-11-10 LAB — CARDIOLIPIN ANTIBODIES, IGG, IGM, IGA
Anticardiolipin IgA: <9 APL U/mL (ref 0–11)
Anticardiolipin IgG: <9 GPL U/mL (ref 0–14)
Anticardiolipin IgM: 12 MPL U/mL (ref 0–12)

## 2021-11-10 LAB — PROTEIN S, TOTAL: Protein S Ag, Total: 106 % (ref 60–150)

## 2021-11-10 LAB — PROTEIN C ACTIVITY: Protein C Activity: 149 % (ref 73–180)

## 2021-11-10 LAB — BETA-2-GLYCOPROTEIN I ABS, IGG/M/A
Beta-2 Glyco I IgG: 15 GPI IgG units (ref 0–20)
Beta-2-Glycoprotein I IgA: <9 GPI IgA units (ref 0–25)
Beta-2-Glycoprotein I IgM: <9 GPI IgM units (ref 0–32)

## 2021-11-10 LAB — PROTEIN S ACTIVITY: Protein S Activity: 106 % (ref 63–140)

## 2021-11-10 LAB — PROTEIN C, TOTAL: Protein C, Total: 94 % (ref 60–150)

## 2021-11-14 LAB — PROTHROMBIN GENE MUTATION

## 2021-11-14 LAB — FACTOR 5 LEIDEN

## 2021-11-16 ENCOUNTER — Telehealth: Payer: Self-pay | Admitting: Cardiology

## 2021-11-16 NOTE — Telephone Encounter (Signed)
Pt c/o medication issue: ? ?1. Name of Medication: Rosuvastatin ? ?2. How are you currently taking this medication (dosage and times per day)? 1 time a day ? ?3. Are you having a reaction (difficulty breathing--STAT)? no ? ?4. What is your medication issue? hurting in her back under left shoulder blade- wants to know if she can stop taking the  ?

## 2021-11-16 NOTE — Telephone Encounter (Signed)
Patient reports she started rosuvastatin 20 mg daily one week ago and now has a burning, stabbing pain below her rt shoulder blade. She does not get relief with tylenol, tylenol pm, icy hot or heating pad. Recommended that she stop taking rosuvastatin for 2 weeks to see if the pain goes away. If the pain goes away, she can restart the rosuvastatin to see if the pain returns. In the meantime, she has an appointment with her PCP this coming Friday and will advise her PCP of the pain below her rt shoulder blade. ?

## 2021-11-17 ENCOUNTER — Telehealth: Payer: Self-pay

## 2021-11-17 NOTE — Telephone Encounter (Signed)
Pt verbalized understanding.

## 2021-11-17 NOTE — Telephone Encounter (Signed)
-----   Message from Ladell Pier, MD sent at 11/17/2021  6:26 AM EDT ----- ?Please call patient, she is a heterozygote for the factor V Leiden mutation-this increases the risk of venous thrombosis, continue anticoagulation therapy, follow-up as scheduled ? ?

## 2021-11-18 ENCOUNTER — Ambulatory Visit
Admission: RE | Admit: 2021-11-18 | Discharge: 2021-11-18 | Disposition: A | Discharge status: Home / Self Care | Payer: Medicare Other | Source: Ambulatory Visit | Attending: Family Medicine | Admitting: Family Medicine

## 2021-11-18 DIAGNOSIS — I2510 Atherosclerotic heart disease of native coronary artery without angina pectoris: Secondary | ICD-10-CM | POA: Diagnosis not present

## 2021-11-18 DIAGNOSIS — I700 Atherosclerosis of aorta: Secondary | ICD-10-CM | POA: Diagnosis not present

## 2021-11-18 DIAGNOSIS — Z7901 Long term (current) use of anticoagulants: Secondary | ICD-10-CM | POA: Diagnosis not present

## 2021-11-18 DIAGNOSIS — Z13820 Encounter for screening for osteoporosis: Secondary | ICD-10-CM | POA: Diagnosis not present

## 2021-11-18 DIAGNOSIS — Z0000 Encounter for general adult medical examination without abnormal findings: Secondary | ICD-10-CM | POA: Diagnosis not present

## 2021-11-18 DIAGNOSIS — Z1231 Encounter for screening mammogram for malignant neoplasm of breast: Secondary | ICD-10-CM | POA: Diagnosis not present

## 2021-11-18 DIAGNOSIS — I10 Essential (primary) hypertension: Secondary | ICD-10-CM | POA: Diagnosis not present

## 2021-11-18 DIAGNOSIS — E782 Mixed hyperlipidemia: Secondary | ICD-10-CM | POA: Diagnosis not present

## 2021-11-18 DIAGNOSIS — Z86711 Personal history of pulmonary embolism: Secondary | ICD-10-CM | POA: Diagnosis not present

## 2021-11-18 DIAGNOSIS — Z23 Encounter for immunization: Secondary | ICD-10-CM | POA: Diagnosis not present

## 2021-11-21 ENCOUNTER — Other Ambulatory Visit: Payer: Self-pay | Admitting: Family Medicine

## 2021-11-21 DIAGNOSIS — Z13820 Encounter for screening for osteoporosis: Secondary | ICD-10-CM

## 2021-12-13 ENCOUNTER — Other Ambulatory Visit: Payer: Medicare Other

## 2021-12-13 ENCOUNTER — Inpatient Hospital Stay: Payer: Medicare Other | Attending: Oncology | Admitting: Oncology

## 2021-12-13 DIAGNOSIS — D6851 Activated protein C resistance: Secondary | ICD-10-CM | POA: Insufficient documentation

## 2021-12-13 DIAGNOSIS — K219 Gastro-esophageal reflux disease without esophagitis: Secondary | ICD-10-CM | POA: Insufficient documentation

## 2021-12-13 DIAGNOSIS — Z87891 Personal history of nicotine dependence: Secondary | ICD-10-CM | POA: Insufficient documentation

## 2021-12-13 DIAGNOSIS — I2699 Other pulmonary embolism without acute cor pulmonale: Secondary | ICD-10-CM | POA: Diagnosis not present

## 2021-12-13 DIAGNOSIS — Z7901 Long term (current) use of anticoagulants: Secondary | ICD-10-CM | POA: Diagnosis not present

## 2021-12-13 DIAGNOSIS — Z86711 Personal history of pulmonary embolism: Secondary | ICD-10-CM | POA: Diagnosis not present

## 2021-12-13 DIAGNOSIS — J449 Chronic obstructive pulmonary disease, unspecified: Secondary | ICD-10-CM | POA: Insufficient documentation

## 2021-12-13 NOTE — Progress Notes (Signed)
  [facility] OFFICE PROGRESS NOTE   Diagnosis: Pulmonary embolism  INTERVAL HISTORY:   Christine Donaldson returns as scheduled.  She continues apixaban.  No bleeding or symptom of thrombosis.  She recently had a tooth implant.  Apixaban was held for a few doses and there was no bleeding. She reports her daughter is on reduced intensity long-term anticoagulation.  Objective:  Vital signs in last 24 hours:  There were no vitals taken for this visit.    Resp: Slight decrease in breath sounds at the left compared to the right lower posterior chest, no respiratory distress Cardio: Regular rate and rhythm GI: No hepatosplenomegaly Vascular: No leg edema   Lab Results:  Lab Results  Component Value Date   WBC 3.8 (L) 11/07/2021   HGB 12.4 11/07/2021   HCT 37.2 11/07/2021   MCV 89.9 11/07/2021   PLT 157 11/07/2021   NEUTROABS 2.2 11/07/2021    CMP  Lab Results  Component Value Date   NA 143 10/20/2021   K 4.5 10/20/2021   CL 104 10/20/2021   CO2 27 10/20/2021   GLUCOSE 97 10/20/2021   BUN 10 10/20/2021   CREATININE 0.91 10/20/2021   CALCIUM 9.8 10/20/2021   GFRNONAA >60 09/29/2017   GFRAA >60 09/29/2017     Medications: I have reviewed the patient's current medications.   Assessment/Plan: Pulmonary embolism 09/28/2017 Negative bilateral lower extremity Dopplers 09/30/2017 Admission 09/28/2017-discharged 10/01/2017, Lovenox anticoagulation on admission, converted to apixaban prior to discharge Hypercoagulation panel 11/08/2021, factor V Leiden heterozygote Family history of venous and arterial thromboembolic disease Daughter with Crohn's disease and history of pulmonary embolism and portal/mesenteric thrombosis Sister and mother with myocardial infarction  3.   COPD 4.   History of tobacco use 5.   Gastroesophageal reflux disease 6.   History of miscarriage 7.   Factor V Leiden heterozygote    Disposition: Christine Donaldson was diagnosed with an  unprovoked pulmonary embolism in 2019.  She continues apixaban anticoagulation.  A hypercoagulation panel last month confirmed she is a heterozygote for the factor V Leiden mutation.  I recommend indefinite anticoagulation therapy.  She has been maintained on full dose apixaban for the past 4 years.  We discussed data supporting the use of reduced intensity long-term anticoagulation.  We discussed the risk and potential benefit of reducing the apixaban dose.  She agrees to change the apixaban dose to 2.5 mg twice daily beginning with the next prescription.  She understands she will remain at increased risk for bleeding while on anticoagulation therapy.  She will take appropriate precautions.  She plans to continue follow-up with her primary provider.  I am available to see her in the future as needed.  Christine Coder, MD  12/13/2021  10:08 AM

## 2021-12-14 DIAGNOSIS — D689 Coagulation defect, unspecified: Secondary | ICD-10-CM | POA: Insufficient documentation

## 2021-12-14 DIAGNOSIS — D6851 Activated protein C resistance: Secondary | ICD-10-CM | POA: Insufficient documentation

## 2021-12-14 DIAGNOSIS — D6869 Other thrombophilia: Secondary | ICD-10-CM | POA: Insufficient documentation

## 2021-12-14 DIAGNOSIS — R0609 Other forms of dyspnea: Secondary | ICD-10-CM | POA: Insufficient documentation

## 2021-12-14 DIAGNOSIS — E785 Hyperlipidemia, unspecified: Secondary | ICD-10-CM | POA: Insufficient documentation

## 2021-12-14 DIAGNOSIS — R000 Tachycardia, unspecified: Secondary | ICD-10-CM | POA: Insufficient documentation

## 2021-12-14 DIAGNOSIS — R0602 Shortness of breath: Secondary | ICD-10-CM | POA: Insufficient documentation

## 2021-12-14 NOTE — Progress Notes (Unsigned)
Cardiology Office Note   Date:  12/15/2021   ID:  JAYLINN HELLENBRAND, DOB 09-12-1955, MRN [id number]  PCP:  Ramiro Harvest, PA-C  Cardiologist:   Minus Breeding, MD Referring:  Ramiro Harvest, PA-C  Chief Complaint  Patient presents with   Shortness of Breath      History of Present Illness: Christine Donaldson is a 66 y.o. female who presents for evaluation of shortness of breath.I saw her in 2015.   This was for evaluation of palpitations.  She had a strong family history of CAD and had a negative POET (Plain Old Exercise Treadmill) .  Echo in 2019 demonstrated an EF of 55%.  In 2019 this was done because she had pulmonary embolism.  There was no etiology for this.  She did have a CT and I saw that she had some coronary calcification noted and aortic atherosclerosis noted.  She was seen recently for SOB. I sent her for a coronary CT and she had non obstructive disease.  She returns for follow-up.  I did start her on a statin because of the degree of coronary calcium she had.  She gets some aching in her shoulders when she started this.  She held it for 2 weeks and has restarted it every other day and seems to be doing better.  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 The patient denies any new symptoms such as chest discomfort, neck or arm discomfort. There has been no new shortness of breath, PND or orthopnea. There have been no reported palpitations, presyncope or syncope.    Past Medical History:  Diagnosis Date   Elevated blood sugar    Pulmonary embolism (HCC)     Past Surgical History:  Procedure Laterality Date   BACK SURGERY     Lumbar     Current Outpatient Medications  Medication Sig Dispense Refill   amLODipine (NORVASC) 2.5 MG tablet Take 2.5 mg by mouth daily.     fluticasone (FLONASE) 50 MCG/ACT nasal spray daily in the afternoon.     KRILL OIL PO Take 750 mg by mouth. '600mg'$  daily      Loratadine 10 MG CAPS Take 10 mg by mouth daily.     Multiple  Vitamin (MULTIVITAMIN) tablet Take 1 tablet by mouth daily.     pantoprazole (PROTONIX) 40 MG tablet Take 40 mg by mouth daily.     rosuvastatin (CRESTOR) 20 MG tablet Take 1 tablet (20 mg total) by mouth daily. (Patient taking differently: Take 20 mg by mouth every other day.) 90 tablet 3   vitamin B-12 (CYANOCOBALAMIN) 1000 MCG tablet Take 1,000 mcg by mouth. liquiod  Also contains folic acid     Vitamin D, Cholecalciferol, 1000 UNITS TABS Take by mouth. VITAMIN D # 3  2000ius daily     No current facility-administered medications for this visit.    Allergies:   Sulfa antibiotics   ROS:  Please see the history of present illness.   Otherwise, review of systems are positive for none.   All other systems are reviewed and negative.    PHYSICAL EXAM: VS:  BP 138/68   Pulse 90   Ht '5\' 6"'$  (1.676 m)   Wt 174 lb (78.9 kg)   SpO2 90%   BMI 28.08 kg/m  , BMI Body mass index is 28.08 kg/m. GENERAL:  Well appearing NECK:  No jugular venous distention, waveform within normal limits, carotid upstroke brisk and symmetric, no bruits, no thyromegaly  LUNGS:  Clear to auscultation bilaterally CHEST:  Unremarkable HEART:  PMI not displaced or sustained,S1 and S2 within normal limits, no S3, no S4, no clicks, no rubs, no murmurs ABD:  Flat, positive bowel sounds normal in frequency in pitch, no bruits, no rebound, no guarding, no midline pulsatile mass, no hepatomegaly, no splenomegaly EXT:  2 plus pulses throughout, no edema, no cyanosis no clubbing   EKG:  EKG is not ordered today.   Recent Labs: 10/20/2021: BUN 10; Creatinine, Ser 0.91; Potassium 4.5; Sodium 143 11/07/2021: Hemoglobin 12.4; Platelet Count 157    Lipid Panel No results found for: CHOL, TRIG, HDL, CHOLHDL, VLDL, LDLCALC, LDLDIRECT    Wt Readings from Last 3 Encounters:  12/15/21 174 lb (78.9 kg)  11/08/21 175 lb 6.4 oz (79.6 kg)  10/20/21 176 lb 12.8 oz (80.2 kg)      Other studies Reviewed: Additional studies/  records that were reviewed today include: Labs Review of the above records demonstrates:  Please see elsewhere in the note.     ASSESSMENT AND PLAN:  SOB: She is having no further symptoms.  She has nonobstructive disease.  No further work-up.  PE:    She was found to be a heterozygote for factor V Leiden mutation.  In further discussion with hematology we will reduce her to maintenance dose of 2 and half milligrams twice daily Eliquis.    Dyslipidemia: Her most recent LDL was elevated but we are going to check to see in 10 weeks for her LDL is with a goal of at least less than 100.   Tachycardia: She has some asymptomatic tachypalpitations.   She will get a TSH when she gets her blood work done in the future.     Current medicines are reviewed at length with the patient today.  The patient does not have concerns regarding medicines.  The following changes have been made: As above  Labs/ tests ordered today include:   Orders Placed This Encounter  Procedures   TSH     Disposition:   FU with me in about 18 months.   Signed, Minus Breeding, MD  12/15/2021 2:22 PM    [facility]

## 2021-12-15 ENCOUNTER — Ambulatory Visit (INDEPENDENT_AMBULATORY_CARE_PROVIDER_SITE_OTHER): Payer: Medicare Other | Admitting: Cardiology

## 2021-12-15 ENCOUNTER — Ambulatory Visit: Payer: Medicare Other | Admitting: Cardiology

## 2021-12-15 ENCOUNTER — Encounter: Payer: Self-pay | Admitting: Cardiology

## 2021-12-15 ENCOUNTER — Other Ambulatory Visit: Payer: Self-pay | Admitting: *Deleted

## 2021-12-15 VITALS — BP 138/68 | HR 90 | Ht 66.0 in | Wt 174.0 lb

## 2021-12-15 DIAGNOSIS — E785 Hyperlipidemia, unspecified: Secondary | ICD-10-CM | POA: Diagnosis not present

## 2021-12-15 DIAGNOSIS — D689 Coagulation defect, unspecified: Secondary | ICD-10-CM

## 2021-12-15 DIAGNOSIS — R000 Tachycardia, unspecified: Secondary | ICD-10-CM | POA: Diagnosis not present

## 2021-12-15 DIAGNOSIS — R0602 Shortness of breath: Secondary | ICD-10-CM | POA: Diagnosis not present

## 2021-12-15 DIAGNOSIS — D6869 Other thrombophilia: Secondary | ICD-10-CM

## 2021-12-15 DIAGNOSIS — I2510 Atherosclerotic heart disease of native coronary artery without angina pectoris: Secondary | ICD-10-CM | POA: Diagnosis not present

## 2021-12-15 MED ORDER — APIXABAN 2.5 MG PO TABS: 5.0000 mg | ORAL_TABLET | Freq: Two times a day (BID) | ORAL | 2 refills | Status: AC

## 2021-12-15 NOTE — Patient Instructions (Addendum)
Medication Instructions:  No changes  *If you need a refill on your cardiac medications before your next appointment, please call your pharmacy*   Lab Work: TSH  when have labs done in 10 weeks    If you have labs (blood work) drawn today and your tests are completely normal, you will receive your results only by: [place] (if you have MyChart) OR A paper copy in the mail If you have any lab test that is abnormal or we need to change your treatment, we will call you to review the results.   Testing/Procedures:  Not needed  Follow-Up: At [facility], you and your health needs are our priority.  As part of our continuing mission to provide you with exceptional heart care, we have created designated Provider Care Teams.  These Care Teams include your primary Cardiologist (physician) and Advanced Practice Providers (APPs -  Physician Assistants and Nurse Practitioners) who all work together to provide you with the care you need, when you need it.     Your next appointment:   18 month(s)  The format for your next appointment:   In Person  Provider:   Minus Breeding, MD

## 2021-12-16 DIAGNOSIS — I10 Essential (primary) hypertension: Secondary | ICD-10-CM | POA: Diagnosis not present

## 2021-12-16 DIAGNOSIS — Z86711 Personal history of pulmonary embolism: Secondary | ICD-10-CM | POA: Diagnosis not present

## 2022-03-29 ENCOUNTER — Encounter: Payer: Self-pay | Admitting: *Deleted

## 2022-04-18 DIAGNOSIS — D6869 Other thrombophilia: Secondary | ICD-10-CM | POA: Diagnosis not present

## 2022-04-18 DIAGNOSIS — R000 Tachycardia, unspecified: Secondary | ICD-10-CM | POA: Diagnosis not present

## 2022-04-18 DIAGNOSIS — R0602 Shortness of breath: Secondary | ICD-10-CM | POA: Diagnosis not present

## 2022-04-18 DIAGNOSIS — I2510 Atherosclerotic heart disease of native coronary artery without angina pectoris: Secondary | ICD-10-CM | POA: Diagnosis not present

## 2022-04-18 LAB — LIPID PANEL
Chol/HDL Ratio: 2.2 ratio (ref 0.0–4.4)
Cholesterol, Total: 105 mg/dL (ref 100–199)
HDL: 47 mg/dL (ref >39)
LDL Chol Calc (NIH): 38 mg/dL (ref 0–99)
Triglycerides: 107 mg/dL (ref 0–149)
VLDL Cholesterol Cal: 20 mg/dL (ref 5–40)

## 2022-04-19 LAB — TSH: TSH: 3.08 u[IU]/mL (ref 0.450–4.500)

## 2022-04-24 ENCOUNTER — Encounter: Payer: Self-pay | Admitting: *Deleted

## 2022-05-01 DIAGNOSIS — Z23 Encounter for immunization: Secondary | ICD-10-CM | POA: Diagnosis not present

## 2022-05-19 ENCOUNTER — Ambulatory Visit
Admission: RE | Admit: 2022-05-19 | Discharge: 2022-05-19 | Disposition: A | Discharge status: Home / Self Care | Payer: Medicare Other | Source: Ambulatory Visit | Attending: Family Medicine | Admitting: Family Medicine

## 2022-05-19 DIAGNOSIS — Z780 Asymptomatic menopausal state: Secondary | ICD-10-CM | POA: Diagnosis not present

## 2022-05-19 DIAGNOSIS — M8589 Other specified disorders of bone density and structure, multiple sites: Secondary | ICD-10-CM | POA: Diagnosis not present

## 2022-05-19 DIAGNOSIS — Z13820 Encounter for screening for osteoporosis: Secondary | ICD-10-CM

## 2022-06-09 DIAGNOSIS — I10 Essential (primary) hypertension: Secondary | ICD-10-CM | POA: Diagnosis not present

## 2022-06-09 DIAGNOSIS — D6851 Activated protein C resistance: Secondary | ICD-10-CM | POA: Diagnosis not present

## 2022-06-09 DIAGNOSIS — I700 Atherosclerosis of aorta: Secondary | ICD-10-CM | POA: Diagnosis not present

## 2022-06-09 DIAGNOSIS — J432 Centrilobular emphysema: Secondary | ICD-10-CM | POA: Diagnosis not present

## 2022-06-09 DIAGNOSIS — E782 Mixed hyperlipidemia: Secondary | ICD-10-CM | POA: Diagnosis not present

## 2022-06-09 DIAGNOSIS — Z7901 Long term (current) use of anticoagulants: Secondary | ICD-10-CM | POA: Diagnosis not present

## 2022-06-09 DIAGNOSIS — I2510 Atherosclerotic heart disease of native coronary artery without angina pectoris: Secondary | ICD-10-CM | POA: Diagnosis not present

## 2022-06-22 ENCOUNTER — Other Ambulatory Visit: Payer: Self-pay | Admitting: Physician Assistant

## 2022-08-01 DIAGNOSIS — I700 Atherosclerosis of aorta: Secondary | ICD-10-CM | POA: Diagnosis not present

## 2022-08-01 DIAGNOSIS — J432 Centrilobular emphysema: Secondary | ICD-10-CM | POA: Diagnosis not present

## 2022-08-01 DIAGNOSIS — D6869 Other thrombophilia: Secondary | ICD-10-CM | POA: Diagnosis not present

## 2022-08-01 DIAGNOSIS — I2510 Atherosclerotic heart disease of native coronary artery without angina pectoris: Secondary | ICD-10-CM | POA: Diagnosis not present

## 2022-08-01 DIAGNOSIS — D6851 Activated protein C resistance: Secondary | ICD-10-CM | POA: Diagnosis not present

## 2022-08-01 DIAGNOSIS — I10 Essential (primary) hypertension: Secondary | ICD-10-CM | POA: Diagnosis not present

## 2022-08-01 DIAGNOSIS — E782 Mixed hyperlipidemia: Secondary | ICD-10-CM | POA: Diagnosis not present

## 2022-08-09 ENCOUNTER — Encounter (HOSPITAL_COMMUNITY): Payer: Self-pay

## 2022-08-09 ENCOUNTER — Emergency Department (HOSPITAL_BASED_OUTPATIENT_CLINIC_OR_DEPARTMENT_OTHER): Payer: Medicare Other

## 2022-08-09 ENCOUNTER — Other Ambulatory Visit: Payer: Self-pay

## 2022-08-09 ENCOUNTER — Inpatient Hospital Stay (HOSPITAL_BASED_OUTPATIENT_CLINIC_OR_DEPARTMENT_OTHER)
Admission: EM | Admit: 2022-08-09 | Discharge: 2022-08-12 | DRG: 193 | Disposition: A | Discharge status: Home / Self Care | Payer: Medicare Other | Source: Ambulatory Visit | Attending: Internal Medicine | Admitting: Internal Medicine

## 2022-08-09 ENCOUNTER — Encounter (HOSPITAL_BASED_OUTPATIENT_CLINIC_OR_DEPARTMENT_OTHER): Payer: Self-pay | Admitting: Emergency Medicine

## 2022-08-09 ENCOUNTER — Emergency Department (HOSPITAL_BASED_OUTPATIENT_CLINIC_OR_DEPARTMENT_OTHER): Payer: Medicare Other | Admitting: Radiology

## 2022-08-09 DIAGNOSIS — J9601 Acute respiratory failure with hypoxia: Secondary | ICD-10-CM | POA: Diagnosis present

## 2022-08-09 DIAGNOSIS — Z7901 Long term (current) use of anticoagulants: Secondary | ICD-10-CM

## 2022-08-09 DIAGNOSIS — Z87891 Personal history of nicotine dependence: Secondary | ICD-10-CM

## 2022-08-09 DIAGNOSIS — I10 Essential (primary) hypertension: Secondary | ICD-10-CM | POA: Diagnosis not present

## 2022-08-09 DIAGNOSIS — J209 Acute bronchitis, unspecified: Secondary | ICD-10-CM | POA: Diagnosis present

## 2022-08-09 DIAGNOSIS — R051 Acute cough: Secondary | ICD-10-CM | POA: Diagnosis not present

## 2022-08-09 DIAGNOSIS — Z8249 Family history of ischemic heart disease and other diseases of the circulatory system: Secondary | ICD-10-CM

## 2022-08-09 DIAGNOSIS — Z882 Allergy status to sulfonamides status: Secondary | ICD-10-CM | POA: Diagnosis not present

## 2022-08-09 DIAGNOSIS — J189 Pneumonia, unspecified organism: Secondary | ICD-10-CM | POA: Diagnosis not present

## 2022-08-09 DIAGNOSIS — R002 Palpitations: Secondary | ICD-10-CM | POA: Diagnosis present

## 2022-08-09 DIAGNOSIS — Z825 Family history of asthma and other chronic lower respiratory diseases: Secondary | ICD-10-CM | POA: Diagnosis not present

## 2022-08-09 DIAGNOSIS — Z1152 Encounter for screening for COVID-19: Secondary | ICD-10-CM | POA: Diagnosis not present

## 2022-08-09 DIAGNOSIS — Z888 Allergy status to other drugs, medicaments and biological substances status: Secondary | ICD-10-CM

## 2022-08-09 DIAGNOSIS — Z79899 Other long term (current) drug therapy: Secondary | ICD-10-CM

## 2022-08-09 DIAGNOSIS — Q2112 Patent foramen ovale: Secondary | ICD-10-CM

## 2022-08-09 DIAGNOSIS — R0609 Other forms of dyspnea: Secondary | ICD-10-CM | POA: Diagnosis not present

## 2022-08-09 DIAGNOSIS — R0602 Shortness of breath: Secondary | ICD-10-CM | POA: Diagnosis not present

## 2022-08-09 DIAGNOSIS — E785 Hyperlipidemia, unspecified: Secondary | ICD-10-CM | POA: Diagnosis present

## 2022-08-09 DIAGNOSIS — J432 Centrilobular emphysema: Secondary | ICD-10-CM | POA: Diagnosis not present

## 2022-08-09 DIAGNOSIS — Z86711 Personal history of pulmonary embolism: Secondary | ICD-10-CM | POA: Diagnosis not present

## 2022-08-09 DIAGNOSIS — E876 Hypokalemia: Secondary | ICD-10-CM | POA: Diagnosis present

## 2022-08-09 DIAGNOSIS — D6851 Activated protein C resistance: Secondary | ICD-10-CM | POA: Diagnosis present

## 2022-08-09 DIAGNOSIS — J3489 Other specified disorders of nose and nasal sinuses: Secondary | ICD-10-CM | POA: Diagnosis not present

## 2022-08-09 DIAGNOSIS — R7989 Other specified abnormal findings of blood chemistry: Secondary | ICD-10-CM | POA: Diagnosis present

## 2022-08-09 DIAGNOSIS — R600 Localized edema: Secondary | ICD-10-CM | POA: Diagnosis present

## 2022-08-09 DIAGNOSIS — R42 Dizziness and giddiness: Secondary | ICD-10-CM | POA: Diagnosis not present

## 2022-08-09 DIAGNOSIS — Z03818 Encounter for observation for suspected exposure to other biological agents ruled out: Secondary | ICD-10-CM | POA: Diagnosis not present

## 2022-08-09 DIAGNOSIS — R7981 Abnormal blood-gas level: Secondary | ICD-10-CM | POA: Diagnosis not present

## 2022-08-09 DIAGNOSIS — R911 Solitary pulmonary nodule: Secondary | ICD-10-CM | POA: Diagnosis not present

## 2022-08-09 DIAGNOSIS — R519 Headache, unspecified: Secondary | ICD-10-CM | POA: Diagnosis not present

## 2022-08-09 LAB — BASIC METABOLIC PANEL
Anion gap: 11 (ref 5–15)
BUN: 14 mg/dL (ref 8–23)
CO2: 29 mmol/L (ref 22–32)
Calcium: 10.2 mg/dL (ref 8.9–10.3)
Chloride: 101 mmol/L (ref 98–111)
Creatinine, Ser: 0.88 mg/dL (ref 0.44–1.00)
GFR, Estimated: >60 mL/min (ref >60)
Glucose, Bld: 142 mg/dL — ABNORMAL HIGH (ref 70–99)
Potassium: 3.8 mmol/L (ref 3.5–5.1)
Sodium: 141 mmol/L (ref 135–145)

## 2022-08-09 LAB — PROTIME-INR
INR: 1.4 — ABNORMAL HIGH (ref 0.8–1.2)
Prothrombin Time: 17.4 seconds — ABNORMAL HIGH (ref 11.4–15.2)

## 2022-08-09 LAB — CBC
HCT: 34.3 % — ABNORMAL LOW (ref 36.0–46.0)
Hemoglobin: 11.7 g/dL — ABNORMAL LOW (ref 12.0–15.0)
MCH: 29.6 pg (ref 26.0–34.0)
MCHC: 34.1 g/dL (ref 30.0–36.0)
MCV: 86.8 fL (ref 80.0–100.0)
Platelets: 215 10*3/uL (ref 150–400)
RBC: 3.95 MIL/uL (ref 3.87–5.11)
RDW: 12.4 % (ref 11.5–15.5)
WBC: 6.9 10*3/uL (ref 4.0–10.5)
nRBC: 0 % (ref 0.0–0.2)

## 2022-08-09 LAB — RESP PANEL BY RT-PCR (RSV, FLU A&B, COVID)  RVPGX2
Influenza A by PCR: NEGATIVE
Influenza B by PCR: NEGATIVE
Resp Syncytial Virus by PCR: NEGATIVE
SARS Coronavirus 2 by RT PCR: NEGATIVE

## 2022-08-09 LAB — APTT: aPTT: 44 seconds — ABNORMAL HIGH (ref 24–36)

## 2022-08-09 MED ORDER — LOSARTAN POTASSIUM 25 MG PO TABS
25.0000 mg | ORAL_TABLET | Freq: Once | ORAL | Status: AC
  Administered 2022-08-09: 25 mg via ORAL
  Filled 2022-08-09: qty 1

## 2022-08-09 MED ORDER — APIXABAN 2.5 MG PO TABS
2.5000 mg | ORAL_TABLET | Freq: Once | ORAL | Status: AC
  Administered 2022-08-09: 2.5 mg via ORAL
  Filled 2022-08-09: qty 1

## 2022-08-09 MED ORDER — SODIUM CHLORIDE 0.9 % IV SOLN
1.0000 g | Freq: Once | INTRAVENOUS | Status: AC
  Administered 2022-08-09: 1 g via INTRAVENOUS
  Filled 2022-08-09: qty 10

## 2022-08-09 MED ORDER — SODIUM CHLORIDE 0.9 % IV BOLUS
1000.0000 mL | Freq: Once | INTRAVENOUS | Status: AC
  Administered 2022-08-09: 1000 mL via INTRAVENOUS

## 2022-08-09 MED ORDER — SODIUM CHLORIDE 0.9 % IV SOLN
500.0000 mg | Freq: Once | INTRAVENOUS | Status: AC
  Administered 2022-08-09: 500 mg via INTRAVENOUS
  Filled 2022-08-09: qty 5

## 2022-08-09 MED ORDER — AMLODIPINE BESYLATE 5 MG PO TABS
5.0000 mg | ORAL_TABLET | Freq: Once | ORAL | Status: AC
  Administered 2022-08-09: 5 mg via ORAL
  Filled 2022-08-09: qty 1

## 2022-08-09 MED ORDER — IOHEXOL 350 MG/ML SOLN
100.0000 mL | Freq: Once | INTRAVENOUS | Status: AC | PRN
  Administered 2022-08-09: 75 mL via INTRAVENOUS

## 2022-08-09 NOTE — ED Provider Notes (Signed)
[facility] EMERGENCY DEPT Provider Note   CSN: [id number] Arrival date & time: 08/09/22  1630     History  Chief Complaint  Patient presents with   Cough    Christine Donaldson is a 67 y.o. female with medical history of elevated blood sugar, PE currently on Eliquis.  Patient presents to the ED for evaluation of high blood pressure, low oxygen saturation, shortness of breath on exertion.  Patient reports that beginning 5 days ago she developed cough, sinus congestion and pressure, headache.  Patient reports that the symptoms have been progressing since this time.  Patient reports she has been taking Tylenol Cold and flu at home, Mucinex which does relieve symptoms however symptoms always return when the medication wears off.  Patient reports that [facility] went [facility] today and was redirected to ED for further management due to blood pressure, oxygen saturation, tachycardia.  Patient reports history of PE which she is currently compliant on anticoagulation therapy.  Patient reports that in the last 1 month she has decreased her dose of anticoagulation.  The patient denies any fevers, nausea or vomiting, diarrhea, chest pain, blurred vision.  Patient states that she is currently taking amlodipine and losartan for blood pressure.  Patient reports that she was recently started on losartan and had her amlodipine dosage decreased due to bilateral lower extremity edema.   Cough Associated symptoms: headaches and shortness of breath   Associated symptoms: no chest pain and no fever        Home Medications Prior to Admission medications   Medication Sig Start Date End Date Taking? Authorizing Provider  amLODipine (NORVASC) 2.5 MG tablet Take 2.5 mg by mouth daily. 11/18/21   [provider]  apixaban (ELIQUIS) 2.5 MG TABS tablet Take 2 tablets (5 mg total) by mouth 2 (two) times daily. 12/15/21   Minus Breeding, MD  fluticasone (FLONASE) 50 MCG/ACT nasal spray daily  in the afternoon.    [provider]  KRILL OIL PO Take 750 mg by mouth. '600mg'$  daily     [provider]  Loratadine 10 MG CAPS Take 10 mg by mouth daily.    [provider]  Multiple Vitamin (MULTIVITAMIN) tablet Take 1 tablet by mouth daily.    [provider]  pantoprazole (PROTONIX) 40 MG tablet Take 40 mg by mouth daily. 08/21/21   [provider]  rosuvastatin (CRESTOR) 20 MG tablet Take 1 tablet (20 mg total) by mouth daily. Patient taking differently: Take 20 mg by mouth every other day. 11/07/21   Minus Breeding, MD  vitamin B-12 (CYANOCOBALAMIN) 1000 MCG tablet Take 1,000 mcg by mouth. liquiod  Also contains folic acid    [provider]  Vitamin D, Cholecalciferol, 1000 UNITS TABS Take by mouth. VITAMIN D # 3  2000ius daily    [provider]      Allergies    Bupropion and Sulfa antibiotics    Review of Systems   Review of Systems  Constitutional:  Negative for fever.  HENT:  Positive for congestion and sinus pressure.   Eyes:  Negative for visual disturbance.  Respiratory:  Positive for cough and shortness of breath.   Cardiovascular:  Negative for chest pain.  Neurological:  Positive for headaches.  All other systems reviewed and are negative.   Physical Exam Updated Vital Signs BP (!) 161/85   Pulse (!) 107   Temp 97.9 F (36.6 C) (Oral)   Resp 18   SpO2 100%  Physical Exam Vitals and nursing note reviewed.  Constitutional:      General: She is not in acute distress.    Appearance: Normal appearance. She is well-developed. She is not ill-appearing or toxic-appearing.  HENT:     Head: Normocephalic and atraumatic.     Mouth/Throat:     Mouth: Mucous membranes are moist.     Pharynx: Oropharynx is clear. No oropharyngeal exudate or posterior oropharyngeal erythema.  Eyes:     Conjunctiva/sclera: Conjunctivae normal.  Cardiovascular:     Rate and Rhythm: Normal rate and regular rhythm.     Heart  sounds: No murmur heard. Pulmonary:     Effort: Pulmonary effort is normal. No respiratory distress.     Breath sounds: Normal breath sounds.  Abdominal:     General: Abdomen is flat.     Palpations: Abdomen is soft.     Tenderness: There is no abdominal tenderness.  Musculoskeletal:        General: No swelling.     Cervical back: Neck supple.     Right lower leg: No edema.     Left lower leg: No edema.  Skin:    General: Skin is warm and dry.     Capillary Refill: Capillary refill takes less than 2 seconds.  Neurological:     Mental Status: She is alert and oriented to person, place, and time.  Psychiatric:        Mood and Affect: Mood normal.     ED Results / Procedures / Treatments   Labs (all labs ordered are listed, but only abnormal results are displayed) Labs Reviewed  CBC - Abnormal; Notable for the following components:      Result Value   Hemoglobin 11.7 (*)    HCT 34.3 (*)    All other components within normal limits  BASIC METABOLIC PANEL - Abnormal; Notable for the following components:   Glucose, Bld 142 (*)    All other components within normal limits  APTT - Abnormal; Notable for the following components:   aPTT 44 (*)    All other components within normal limits  PROTIME-INR - Abnormal; Notable for the following components:   Prothrombin Time 17.4 (*)    INR 1.4 (*)    All other components within normal limits  RESP PANEL BY RT-PCR (RSV, FLU A&B, COVID)  [id number]    EKG EKG Interpretation  Date/Time:  Wednesday August 09 2022 17:02:20 EST Ventricular Rate:  103 PR Interval:  195 QRS Duration: 82 QT Interval:  333 QTC Calculation: 436 R Axis:   83 Text Interpretation: Sinus tachycardia Borderline right axis deviation Confirmed by Nanda Quinton [phone]) on 08/09/2022 5:19:07 PM  Radiology CT Angio Chest PE W and/or Wo Contrast  Result Date: 08/09/2022 CLINICAL DATA:  Pulmonary embolus suspected EXAM: CT ANGIOGRAPHY CHEST WITH CONTRAST TECHNIQUE:  Multidetector CT imaging of the chest was performed using the standard protocol during bolus administration of intravenous contrast. Multiplanar CT image reconstructions and MIPs were obtained to evaluate the vascular anatomy. RADIATION DOSE REDUCTION: This exam was performed according to the departmental dose-optimization program which includes automated exposure control, adjustment of the mA and/or kV according to patient size and/or use of iterative reconstruction technique. CONTRAST:  45m OMNIPAQUE IOHEXOL 350 MG/ML SOLN COMPARISON:  Chest CT dated March 18th 2019 FINDINGS: Cardiovascular: No evidence of pulmonary embolus. Normal caliber thoracic aorta with moderate atherosclerotic disease. Mild coronary artery calcifications. Mediastinum/Nodes: Small hiatal hernia. Thyroid is unremarkable. Mildly enlarged AP window lymph node  measuring 1.3 cm in short axis on series 5, image 96. Lungs/Pleura: Central airways are patent. Mild bilateral bronchial wall thickening. Mild centrilobular emphysema. Numerous new tiny scattered centrilobular nodules and nodular consolidations. Reference new nodular opacity measuring 7 mm on series 5, image 53. Solid lingula measuring 4 mm on series 6, image 86, unchanged when compared with prior, considered benign given greater than 1 year stability. Upper Abdomen: No acute abnormality. Musculoskeletal: No chest wall abnormality. No acute or significant osseous findings. Review of the MIP images confirms the above findings. IMPRESSION: 1. No evidence of pulmonary embolus. 2. Numerous new scattered small centrilobular nodules and nodular consolidations, findings are likely due to infection. Recommend follow-up in 3 months to ensure resolution. 3. Mildly enlarged AP window lymph node, likely reactive. Recommend attention on follow-up. 4. Aortic Atherosclerosis (ICD10-I70.0) and Emphysema (ICD10-J43.9). Electronically Signed   By: Yetta Glassman M.D.   On: 08/09/2022 18:29   DG Chest 2  View  Result Date: 08/09/2022 CLINICAL DATA:  Shortness of breath EXAM: CHEST - 2 VIEW COMPARISON:  None Available. FINDINGS: The heart size and mediastinal contours are within normal limits. Mild diffuse interstitial pulmonary opacity. The visualized skeletal structures are unremarkable. IMPRESSION: Mild diffuse interstitial pulmonary opacity, consistent with edema or atypical/viral infection. No focal airspace opacity. Electronically Signed   By: Delanna Ahmadi M.D.   On: 08/09/2022 17:30    Procedures .Critical Care E&M  Performed by: Azucena Cecil, PA-C Critical care provider statement:    Critical care time (minutes):  45   Critical care time was exclusive of:  Separately billable procedures and treating other patients   Critical care was necessary to treat or prevent imminent or life-threatening deterioration of the following conditions:  Respiratory failure   Critical care was time spent personally by me on the following activities:  Blood draw for specimens, development of treatment plan with patient or surrogate, discussions with consultants, discussions with primary provider, evaluation of patient's response to treatment, examination of patient, interpretation of cardiac output measurements, obtaining history from patient or surrogate, review of old charts, re-evaluation of patient's condition, pulse oximetry, ordering and review of radiographic studies, ordering and review of laboratory studies and ordering and performing treatments and interventions   I assumed direction of critical care for this patient from another provider in my specialty: no     Care discussed with: admitting provider   After initial E/M assessment, critical care services were subsequently performed that were exclusive of separately billable procedures or treatment.      Medications Ordered in ED Medications  azithromycin (ZITHROMAX) 500 mg in sodium chloride 0.9 % 250 mL IVPB (has no administration in time  range)  iohexol (OMNIPAQUE) 350 MG/ML injection 100 mL (75 mLs Intravenous Contrast Given 08/09/22 1750)  apixaban (ELIQUIS) tablet 2.5 mg (2.5 mg Oral Given 08/09/22 2012)  amLODipine (NORVASC) tablet 5 mg (5 mg Oral Given 08/09/22 2012)  losartan (COZAAR) tablet 25 mg (25 mg Oral Given 08/09/22 2012)  cefTRIAXone (ROCEPHIN) 1 g in sodium chloride 0.9 % 100 mL IVPB (1 g Intravenous New Bag/Given 08/09/22 2017)  sodium chloride 0.9 % bolus 1,000 mL (1,000 mLs Intravenous New Bag/Given 08/09/22 2018)    ED Course/ Medical Decision Making/ A&P                          Medical Decision Making Amount and/or Complexity of Data Reviewed Labs: ordered. Radiology: ordered.   67 year old female presents to  the ED for evaluation.  Please see HPI for further details.  On examination the patient is afebrile and tachycardic to 110.  The patient lung sounds are clear bilaterally however she does seem to have a decreased oxygen saturation on room air with an oxygen saturation on the monitor of 92% resting in bed.  The patient abdomen is soft and compressible throughout.  The patient posterior oropharynx is not erythematous, no exudate.  Patient neurological examination at baseline, no focal neurodeficits.  Patient nontoxic in appearance.  Patient workup will include CBC, BMP, coagulation studies, viral panel, chest x-ray, CT angiogram.  Patient CBC unremarkable with a stable hemoglobin, no leukocytosis.  The patient BMP is unremarkable.  The patient PT/INR, APTT are elevated however the patient does state that she takes Eliquis daily.  The patient respiratory panel is negative for all.  Patient chest x-ray shows a mild diffuse interstitial pulmonary opacity which is consistent with edema versus an atypical/viral infection.  Patient has history of blood clots, she is tachycardic and hypoxic so decision to scan patient chest for PE was made.  The scan will also help to further assess the patient lungs.  The patient  CTA shows no evidence of PE however does show numerous new scattered small centrilobular nodules with nodular consolidations which are consistent with infection.  At this time, patient ambulated on room air and oxygen saturation dropped down to 83%.  Patient placed on 3 L of oxygen via nasal cannula at this time.  CAP treatment ordered at this time to include Rocephin, azithromycin.  Patient was provided 1 L fluid for tachycardia.  Patient blood pressure medication ordered at this time to include 5 mg amlodipine, 25 mg losartan.  Patient was provided 2.5 mg Eliquis which is her home dosage.  Case discussed with Dr. Alcario Drought, tried hospitalist, who has agreed to admit the patient.  The patient is amenable to the plan.  The patient is stable at the time of admission.  Final Clinical Impression(s) / ED Diagnoses Final diagnoses:  Community acquired pneumonia, unspecified laterality  Acute respiratory failure with hypoxia [facility])    Rx / DC Orders ED Discharge Orders     None         Lawana Chambers 08/09/22 2049    Margette Fast, MD 08/17/22 [phone]

## 2022-08-09 NOTE — ED Triage Notes (Signed)
Seen by PCP for illness x 5 day, low spo2 during seen by UC  eval for pneumonia  Cough, runny nose sob.

## 2022-08-10 ENCOUNTER — Encounter (HOSPITAL_COMMUNITY): Payer: Self-pay | Admitting: Internal Medicine

## 2022-08-10 DIAGNOSIS — R911 Solitary pulmonary nodule: Secondary | ICD-10-CM

## 2022-08-10 DIAGNOSIS — Z825 Family history of asthma and other chronic lower respiratory diseases: Secondary | ICD-10-CM | POA: Diagnosis not present

## 2022-08-10 DIAGNOSIS — E876 Hypokalemia: Secondary | ICD-10-CM | POA: Diagnosis not present

## 2022-08-10 DIAGNOSIS — J9601 Acute respiratory failure with hypoxia: Secondary | ICD-10-CM

## 2022-08-10 DIAGNOSIS — R600 Localized edema: Secondary | ICD-10-CM | POA: Diagnosis not present

## 2022-08-10 DIAGNOSIS — Z86711 Personal history of pulmonary embolism: Secondary | ICD-10-CM

## 2022-08-10 DIAGNOSIS — R0609 Other forms of dyspnea: Secondary | ICD-10-CM

## 2022-08-10 DIAGNOSIS — Z7901 Long term (current) use of anticoagulants: Secondary | ICD-10-CM | POA: Diagnosis not present

## 2022-08-10 DIAGNOSIS — Z888 Allergy status to other drugs, medicaments and biological substances status: Secondary | ICD-10-CM | POA: Diagnosis not present

## 2022-08-10 DIAGNOSIS — R002 Palpitations: Secondary | ICD-10-CM | POA: Diagnosis not present

## 2022-08-10 DIAGNOSIS — Z8249 Family history of ischemic heart disease and other diseases of the circulatory system: Secondary | ICD-10-CM | POA: Diagnosis not present

## 2022-08-10 DIAGNOSIS — Z882 Allergy status to sulfonamides status: Secondary | ICD-10-CM | POA: Diagnosis not present

## 2022-08-10 DIAGNOSIS — J209 Acute bronchitis, unspecified: Secondary | ICD-10-CM | POA: Diagnosis not present

## 2022-08-10 DIAGNOSIS — J189 Pneumonia, unspecified organism: Secondary | ICD-10-CM | POA: Diagnosis not present

## 2022-08-10 DIAGNOSIS — D6851 Activated protein C resistance: Secondary | ICD-10-CM | POA: Diagnosis not present

## 2022-08-10 DIAGNOSIS — J432 Centrilobular emphysema: Secondary | ICD-10-CM | POA: Diagnosis not present

## 2022-08-10 DIAGNOSIS — R7989 Other specified abnormal findings of blood chemistry: Secondary | ICD-10-CM | POA: Diagnosis not present

## 2022-08-10 DIAGNOSIS — Q2112 Patent foramen ovale: Secondary | ICD-10-CM | POA: Diagnosis not present

## 2022-08-10 DIAGNOSIS — Z87891 Personal history of nicotine dependence: Secondary | ICD-10-CM | POA: Diagnosis not present

## 2022-08-10 DIAGNOSIS — I10 Essential (primary) hypertension: Secondary | ICD-10-CM | POA: Diagnosis not present

## 2022-08-10 DIAGNOSIS — Z1152 Encounter for screening for COVID-19: Secondary | ICD-10-CM | POA: Diagnosis not present

## 2022-08-10 DIAGNOSIS — E785 Hyperlipidemia, unspecified: Secondary | ICD-10-CM | POA: Diagnosis not present

## 2022-08-10 DIAGNOSIS — Z79899 Other long term (current) drug therapy: Secondary | ICD-10-CM | POA: Diagnosis not present

## 2022-08-10 HISTORY — DX: Essential (primary) hypertension: I10

## 2022-08-10 LAB — CBC WITH DIFFERENTIAL/PLATELET
Abs Immature Granulocytes: 0.04 10*3/uL (ref 0.00–0.07)
Basophils Absolute: 0.1 10*3/uL (ref 0.0–0.1)
Basophils Relative: 1 %
Eosinophils Absolute: 0.2 10*3/uL (ref 0.0–0.5)
Eosinophils Relative: 2 %
HCT: 31.2 % — ABNORMAL LOW (ref 36.0–46.0)
Hemoglobin: 10.5 g/dL — ABNORMAL LOW (ref 12.0–15.0)
Immature Granulocytes: 1 %
Lymphocytes Relative: 16 %
Lymphs Abs: 1.1 10*3/uL (ref 0.7–4.0)
MCH: 29.3 pg (ref 26.0–34.0)
MCHC: 33.7 g/dL (ref 30.0–36.0)
MCV: 87.2 fL (ref 80.0–100.0)
Monocytes Absolute: 0.9 10*3/uL (ref 0.1–1.0)
Monocytes Relative: 13 %
Neutro Abs: 4.7 10*3/uL (ref 1.7–7.7)
Neutrophils Relative %: 67 %
Platelets: 225 10*3/uL (ref 150–400)
RBC: 3.58 MIL/uL — ABNORMAL LOW (ref 3.87–5.11)
RDW: 12.3 % (ref 11.5–15.5)
WBC: 6.8 10*3/uL (ref 4.0–10.5)
nRBC: 0 % (ref 0.0–0.2)

## 2022-08-10 LAB — COMPREHENSIVE METABOLIC PANEL
ALT: 25 U/L (ref 0–44)
AST: 27 U/L (ref 15–41)
Albumin: 3 g/dL — ABNORMAL LOW (ref 3.5–5.0)
Alkaline Phosphatase: 66 U/L (ref 38–126)
Anion gap: 10 (ref 5–15)
BUN: 15 mg/dL (ref 8–23)
CO2: 26 mmol/L (ref 22–32)
Calcium: 8.7 mg/dL — ABNORMAL LOW (ref 8.9–10.3)
Chloride: 103 mmol/L (ref 98–111)
Creatinine, Ser: 0.96 mg/dL (ref 0.44–1.00)
GFR, Estimated: >60 mL/min (ref >60)
Glucose, Bld: 130 mg/dL — ABNORMAL HIGH (ref 70–99)
Potassium: 3.1 mmol/L — ABNORMAL LOW (ref 3.5–5.1)
Sodium: 139 mmol/L (ref 135–145)
Total Bilirubin: 0.5 mg/dL (ref 0.3–1.2)
Total Protein: 6.3 g/dL — ABNORMAL LOW (ref 6.5–8.1)

## 2022-08-10 LAB — MAGNESIUM: Magnesium: 2 mg/dL (ref 1.7–2.4)

## 2022-08-10 LAB — BRAIN NATRIURETIC PEPTIDE: B Natriuretic Peptide: 141.2 pg/mL — ABNORMAL HIGH (ref 0.0–100.0)

## 2022-08-10 LAB — TSH: TSH: 1.694 u[IU]/mL (ref 0.350–4.500)

## 2022-08-10 MED ORDER — CARVEDILOL 6.25 MG PO TABS
6.2500 mg | ORAL_TABLET | Freq: Two times a day (BID) | ORAL | Status: DC
  Administered 2022-08-11 – 2022-08-12 (×2): 6.25 mg via ORAL
  Filled 2022-08-10 (×2): qty 1

## 2022-08-10 MED ORDER — ACETAMINOPHEN 325 MG PO TABS
650.0000 mg | ORAL_TABLET | Freq: Four times a day (QID) | ORAL | Status: DC | PRN
  Administered 2022-08-11: 650 mg via ORAL
  Filled 2022-08-10: qty 2

## 2022-08-10 MED ORDER — AMLODIPINE BESYLATE 5 MG PO TABS
5.0000 mg | ORAL_TABLET | Freq: Every day | ORAL | Status: DC
  Administered 2022-08-10: 5 mg via ORAL
  Filled 2022-08-10: qty 1

## 2022-08-10 MED ORDER — POTASSIUM CHLORIDE CRYS ER 20 MEQ PO TBCR
40.0000 meq | EXTENDED_RELEASE_TABLET | Freq: Once | ORAL | Status: AC
  Administered 2022-08-10: 40 meq via ORAL
  Filled 2022-08-10: qty 2

## 2022-08-10 MED ORDER — METOPROLOL TARTRATE 5 MG/5ML IV SOLN
5.0000 mg | Freq: Once | INTRAVENOUS | Status: AC
  Administered 2022-08-10: 5 mg via INTRAVENOUS
  Filled 2022-08-10: qty 5

## 2022-08-10 MED ORDER — MELATONIN 5 MG PO TABS: 10.0000 mg | ORAL_TABLET | Freq: Every evening | ORAL | Status: DC | PRN

## 2022-08-10 MED ORDER — SODIUM CHLORIDE 0.9 % IV SOLN
1.0000 g | INTRAVENOUS | Status: DC
  Administered 2022-08-10 – 2022-08-11 (×2): 1 g via INTRAVENOUS
  Filled 2022-08-10 (×2): qty 10

## 2022-08-10 MED ORDER — ALBUTEROL SULFATE (2.5 MG/3ML) 0.083% IN NEBU: 2.5000 mg | INHALATION_SOLUTION | RESPIRATORY_TRACT | Status: DC | PRN

## 2022-08-10 MED ORDER — AZITHROMYCIN 250 MG PO TABS
500.0000 mg | ORAL_TABLET | Freq: Every day | ORAL | Status: DC
  Administered 2022-08-10 – 2022-08-11 (×2): 500 mg via ORAL
  Filled 2022-08-10 (×2): qty 2

## 2022-08-10 MED ORDER — ACETAMINOPHEN 650 MG RE SUPP: 650.0000 mg | Freq: Four times a day (QID) | RECTAL | Status: DC | PRN

## 2022-08-10 MED ORDER — LORATADINE 10 MG PO TABS
10.0000 mg | ORAL_TABLET | Freq: Every day | ORAL | Status: DC
  Administered 2022-08-11 – 2022-08-12 (×2): 10 mg via ORAL
  Filled 2022-08-10 (×2): qty 1

## 2022-08-10 MED ORDER — ONDANSETRON HCL 4 MG/2ML IJ SOLN: 4.0000 mg | Freq: Four times a day (QID) | INTRAMUSCULAR | Status: DC | PRN

## 2022-08-10 MED ORDER — ONDANSETRON HCL 4 MG PO TABS: 4.0000 mg | ORAL_TABLET | Freq: Four times a day (QID) | ORAL | Status: DC | PRN

## 2022-08-10 MED ORDER — LOSARTAN POTASSIUM 50 MG PO TABS
25.0000 mg | ORAL_TABLET | Freq: Every day | ORAL | Status: DC
  Administered 2022-08-10: 25 mg via ORAL
  Filled 2022-08-10: qty 1

## 2022-08-10 MED ORDER — APIXABAN 2.5 MG PO TABS
2.5000 mg | ORAL_TABLET | Freq: Two times a day (BID) | ORAL | Status: DC
  Administered 2022-08-10 – 2022-08-12 (×4): 2.5 mg via ORAL
  Filled 2022-08-10 (×4): qty 1

## 2022-08-10 MED ORDER — FLUTICASONE PROPIONATE 50 MCG/ACT NA SUSP
2.0000 | Freq: Every day | NASAL | Status: DC
  Administered 2022-08-11: 2 via NASAL
  Filled 2022-08-10: qty 16

## 2022-08-10 MED ORDER — ROSUVASTATIN CALCIUM 10 MG PO TABS
20.0000 mg | ORAL_TABLET | ORAL | Status: DC
  Administered 2022-08-11: 20 mg via ORAL
  Filled 2022-08-10: qty 2

## 2022-08-10 MED ORDER — LORATADINE 10 MG PO CAPS: 10.0000 mg | ORAL_CAPSULE | Freq: Every day | ORAL | Status: DC

## 2022-08-10 MED ORDER — SODIUM CHLORIDE 0.9 % IV SOLN
500.0000 mg | INTRAVENOUS | Status: DC
  Filled 2022-08-10: qty 5

## 2022-08-10 NOTE — Assessment & Plan Note (Signed)
Stable. Continue crestor 20 mg daily.

## 2022-08-10 NOTE — Subjective & Objective (Signed)
CC: SOB HPI: 67 year old Caucasian female history of prior PE, factor V Leiden mutation on chronic Eliquis, history of dyslipidemia, presents to the ER today with worsening shortness of breath and dyspnea on exertion times a week.  She states that last week around Thursday she started developing a head cold.  She started having nasal drainage.  Her shortness of breath is increased.  However she has noticed over the last 2 to 3 weeks, she has had increasing dyspnea on exertion.  She heats her home with a wood stove.  She states that when she would go outside to put more blood in the Sor[place], she was increasingly dyspneic.  She has had palpitations.  No prior history of A-fib or CHF.  Due to worsening shortness of breath, she has come to the ER for evaluation.  She states that she has been having difficulty with hypertension.  Her PCP had increased her outpatient Norvasc dose to 10 mg but then noticed her having worsening lower extremity edema.  Norvasc was decreased back to 5 mg and 25 mg of losartan was started.  This happened the last week.  She is noted still continued lower extremity edema.  Patient had a CT coronary study done last year which showed about 87% calcium score based on age.  They were only mild plaques.  There is a small PFO with left-to-right shunting.  In the ER, she was noted to be hypoxic with room air saturations of 82%.  Arrival temp 98 heart rate 119 blood pressure 197/91  Lab workup was mostly unremarkable.  White count of 6.9, hemoglobin 11.7, platelets of 215  Serum glucose of 142  COVID-negative, influenza A&B negative, RSV negative.  CTPA was negative for pulmonary embolism.  It showed some mild lingular changes  with a 4 mm solid nodule unchanged compared to last year.  She had some mild bilateral bronchial wall thickening and some mild central lobar emphysema.  There were numerous subcentimeters nodules.  No central adenopathy.  Really nothing on her CT scan that  would explain the level of hypoxia.  Patient started empirically on Rocephin and Zithromax.  She was not given any steroids.  [facility] hospitalist contacted for admission.

## 2022-08-10 NOTE — Assessment & Plan Note (Signed)
Stable Continue Eliquis 

## 2022-08-10 NOTE — ED Notes (Signed)
Called [facility]; spoke to [facility]; was informed no available beds yet, no discharges as of yet.

## 2022-08-10 NOTE — Assessment & Plan Note (Signed)
Given pt's palpitations and LE edema, will stop norvasc and losartan. Start coreg 6.25 mg bid.

## 2022-08-10 NOTE — ED Notes (Signed)
Pt offered bedside commode, but refused.

## 2022-08-10 NOTE — ED Notes (Signed)
Called [facility] and spoke to [place]; informed that the patient Bed Assignment is READY

## 2022-08-10 NOTE — ED Notes (Signed)
Pt requesting to use the restroom. Assisted up to bathroom on 02 @ 3l via n/c. Pt returned from bathroom and was a little sob. On RA for approx 10 seconds patients sats decreased to 81% on RA. 02 placed at 3l via n/c and sats slowly increased to 95%. Pt's heart rate was 136 when she got up to the bathroom but decreased to 105 after sitting in the bed. Primary RN notified, RT asked to reassess pt and will update MD.

## 2022-08-10 NOTE — H&P (Signed)
History and Physical    Christine Donaldson CLE:[id number] DOB: 06-Oct-1955 DOA: 08/09/2022  DOS: the patient was seen and examined on 08/09/2022  PCP: Ramiro Harvest, PA-C   Patient coming from: Home  I have personally briefly reviewed patient's old medical records in [place]  CC: SOB HPI: 67 year old Caucasian female history of prior PE, factor V Leiden mutation on chronic Eliquis, history of dyslipidemia, presents to the ER today with worsening shortness of breath and dyspnea on exertion times a week.  She states that last week around Thursday she started developing a head cold.  She started having nasal drainage.  Her shortness of breath is increased.  However she has noticed over the last 2 to 3 weeks, she has had increasing dyspnea on exertion.  She heats her home with a wood stove.  She states that when she would go outside to put more blood in the [place], she was increasingly dyspneic.  She has had palpitations.  No prior history of A-fib or CHF.  Due to worsening shortness of breath, she has come to the ER for evaluation.  She states that she has been having difficulty with hypertension.  Her PCP had increased her outpatient Norvasc dose to 10 mg but then noticed her having worsening lower extremity edema.  Norvasc was decreased back to 5 mg and 25 mg of losartan was started.  This happened the last week.  She is noted still continued lower extremity edema.  Patient had a CT coronary study done last year which showed about 87% calcium score based on age.  They were only mild plaques.  There is a small PFO with left-to-right shunting.  In the ER, she was noted to be hypoxic with room air saturations of 82%.  Arrival temp 98 heart rate 119 blood pressure 197/91  Lab workup was mostly unremarkable.  White count of 6.9, hemoglobin 11.7, platelets of 215  Serum glucose of 142  COVID-negative, influenza A&B negative, RSV negative.  CTPA was negative for pulmonary embolism.   It showed some mild lingular changes  with a 4 mm solid nodule unchanged compared to last year.  She had some mild bilateral bronchial wall thickening and some mild central lobar emphysema.  There were numerous subcentimeters nodules.  No central adenopathy.  Really nothing on her CT scan that would explain the level of hypoxia.  Patient started empirically on Rocephin and Zithromax.  She was not given any steroids.  [facility] hospital[facility] contacted for admission.   ED Course: CTPA negative for PE  Review of Systems:  Review of Systems  Constitutional: Negative.   HENT:  Positive for congestion.   Eyes: Negative.   Respiratory:  Positive for shortness of breath. Negative for cough.   Cardiovascular:  Positive for palpitations and leg swelling. Negative for chest pain.  Gastrointestinal: Negative.   Genitourinary: Negative.   Musculoskeletal: Negative.   Skin: Negative.   Neurological: Negative.   Endo/Heme/Allergies: Negative.   Psychiatric/Behavioral: Negative.    All other systems reviewed and are negative.   Past Medical History:  Diagnosis Date   Acute pulmonary embolism ([place]) 09/28/2017   Elevated blood sugar    Essential hypertension 08/10/2022   Pulmonary embolism [facility])     Past Surgical History:  Procedure Laterality Date   BACK SURGERY     Lumbar     reports that she has quit smoking. Her smoking use included cigarettes. She has a 32.00 pack-year smoking history. She has never used smokeless tobacco.  She reports that she does not drink alcohol and does not use drugs.  Allergies  Allergen Reactions   Bupropion Other (See Comments)   Sulfa Antibiotics Hives    Family History  Problem Relation Age of Onset   CAD Mother        Died age 34   CAD Sister 39       Died at 43 of cirrhosis   COPD Sister 51   Breast cancer Neg Hx     Prior to Admission medications   Medication Sig Start Date End Date Taking? Authorizing Provider  amLODipine (NORVASC) 10 MG tablet  Take 5 mg by mouth daily. 07/11/22  Yes [provider]  apixaban (ELIQUIS) 2.5 MG TABS tablet Take 2 tablets (5 mg total) by mouth 2 (two) times daily. Patient taking differently: Take 2.5 mg by mouth 2 (two) times daily. 12/15/21  Yes Minus Breeding, MD  fluticasone (FLONASE) 50 MCG/ACT nasal spray 2 sprays daily in the afternoon.   Yes [provider]  KRILL OIL PO Take 500 mg by mouth daily.   Yes [provider]  Loratadine 10 MG CAPS Take 10 mg by mouth daily.   Yes [provider]  losartan (COZAAR) 25 MG tablet Take 25 mg by mouth daily. 08/01/22  Yes [provider]  Multiple Vitamin (MULTIVITAMIN) tablet Take 1 tablet by mouth daily.   Yes [provider]  pantoprazole (PROTONIX) 40 MG tablet Take 40 mg by mouth daily. 08/21/21  Yes [provider]  rosuvastatin (CRESTOR) 20 MG tablet Take 1 tablet (20 mg total) by mouth daily. Patient taking differently: Take 20 mg by mouth every other day. 11/07/21  Yes Hochrein, Jeneen Rinks, MD  Turmeric (QC TUMERIC COMPLEX PO) Take 1 tablet by mouth daily. 1000 mg daily   Yes [provider]  vitamin B-12 (CYANOCOBALAMIN) 1000 MCG tablet Take 1,000 mcg by mouth daily. liquiod  Also contains folic acid   Yes [provider]  Vitamin D, Cholecalciferol, 1000 UNITS TABS Take 1,000 Units by mouth 2 (two) times daily. VITAMIN D # 3  2000ius daily   Yes [provider]    Physical Exam: Vitals:   08/10/22 1550 08/10/22 1551 08/10/22 1700 08/10/22 1937  BP: (!) 148/71  (!) 171/88 (!) 155/75  Pulse:   (!) 115 (!) 108  Resp:   (!) 25 20  Temp:  98.6 F (37 C)    TempSrc:  Oral    SpO2:   96% 98%    Physical Exam Vitals and nursing note reviewed.  Constitutional:      General: She is not in acute distress.    Appearance: Normal appearance. She is not ill-appearing, toxic-appearing or diaphoretic.  HENT:     Head: Normocephalic and atraumatic.     Nose: Nose normal.   Eyes:     General: No scleral icterus. Cardiovascular:     Rate and Rhythm: Regular rhythm. Tachycardia present.     Pulses: Normal pulses.  Pulmonary:     Effort: Pulmonary effort is normal. No respiratory distress.     Breath sounds: Normal breath sounds. No wheezing or rales.  Abdominal:     General: Abdomen is flat. Bowel sounds are normal. There is no distension.     Palpations: Abdomen is soft.     Tenderness: There is no abdominal tenderness. There is no guarding.  Musculoskeletal:     Comments: Trace ankle and pretibial edema bilaterally  Skin:    General: Skin  is warm and dry.     Capillary Refill: Capillary refill takes less than 2 seconds.  Neurological:     General: No focal deficit present.     Mental Status: She is alert and oriented to person, place, and time.      Labs on Admission: I have personally reviewed following labs and imaging studies  CBC: Recent Labs  Lab 08/09/22 1700  WBC 6.9  HGB 11.7*  HCT 34.3*  MCV 86.8  PLT 709   Basic Metabolic Panel: Recent Labs  Lab 08/09/22 1700  NA 141  K 3.8  CL 101  CO2 29  GLUCOSE 142*  BUN 14  CREATININE 0.88  CALCIUM 10.2   GFR: CrCl cannot be calculated (Unknown ideal weight.). Liver Function Tests: No results for input(s): "AST", "ALT", "ALKPHOS", "BILITOT", "PROT", "ALBUMIN" in the last 168 hours. No results for input(s): "LIPASE", "AMYLASE" in the last 168 hours. No results for input(s): "AMMONIA" in the last 168 hours. Coagulation Profile: Recent Labs  Lab 08/09/22 1700  INR 1.4*   Cardiac Enzymes: No results for input(s): "CKTOTAL", "CKMB", "CKMBINDEX", "TROPONINI", "TROPONINIHS" in the last 168 hours. BNP (last 3 results) No results for input(s): "PROBNP" in the last 8760 hours. HbA1C: No results for input(s): "HGBA1C" in the last 72 hours. CBG: No results for input(s): "GLUCAP" in the last 168 hours. Lipid Profile: No results for input(s): "CHOL", "HDL", "LDLCALC", "TRIG",  "CHOLHDL", "LDLDIRECT" in the last 72 hours. Thyroid Function Tests: No results for input(s): "TSH", "T4TOTAL", "FREET4", "T3FREE", "THYROIDAB" in the last 72 hours. Anemia Panel: No results for input(s): "VITAMINB12", "FOLATE", "FERRITIN", "TIBC", "IRON", "RETICCTPCT" in the last 72 hours. Urine analysis: No results found for: "COLORURINE", "APPEARANCEUR", "LABSPEC", "PHURINE", "GLUCOSEU", "HGBUR", "BILIRUBINUR", "KETONESUR", "PROTEINUR", "UROBILINOGEN", "NITRITE", "LEUKOCYTESUR"  Radiological Exams on Admission: I have personally reviewed images CT Angio Chest PE W and/or Wo Contrast  Result Date: 08/09/2022 CLINICAL DATA:  Pulmonary embolus suspected EXAM: CT ANGIOGRAPHY CHEST WITH CONTRAST TECHNIQUE: Multidetector CT imaging of the chest was performed using the standard protocol during bolus administration of intravenous contrast. Multiplanar CT image reconstructions and MIPs were obtained to evaluate the vascular anatomy. RADIATION DOSE REDUCTION: This exam was performed according to the departmental dose-optimization program which includes automated exposure control, adjustment of the mA and/or kV according to patient size and/or use of iterative reconstruction technique. CONTRAST:  72m OMNIPAQUE IOHEXOL 350 MG/ML SOLN COMPARISON:  Chest CT dated March 18th 2019 FINDINGS: Cardiovascular: No evidence of pulmonary embolus. Normal caliber thoracic aorta with moderate atherosclerotic disease. Mild coronary artery calcifications. Mediastinum/Nodes: Small hiatal hernia. Thyroid is unremarkable. Mildly enlarged AP window lymph node measuring 1.3 cm in short axis on series 5, image 96. Lungs/Pleura: Central airways are patent. Mild bilateral bronchial wall thickening. Mild centrilobular emphysema. Numerous new tiny scattered centrilobular nodules and nodular consolidations. Reference new nodular opacity measuring 7 mm on series 5, image 53. Solid lingula measuring 4 mm on series 6, image 86, unchanged when  compared with prior, considered benign given greater than 1 year stability. Upper Abdomen: No acute abnormality. Musculoskeletal: No chest wall abnormality. No acute or significant osseous findings. Review of the MIP images confirms the above findings. IMPRESSION: 1. No evidence of pulmonary embolus. 2. Numerous new scattered small centrilobular nodules and nodular consolidations, findings are likely due to infection. Recommend follow-up in 3 months to ensure resolution. 3. Mildly enlarged AP window lymph node, likely reactive. Recommend attention on follow-up. 4. Aortic Atherosclerosis (ICD10-I70.0) and Emphysema (ICD10-J43.9). Electronically Signed  By: Yetta Glassman M.D.   On: 08/09/2022 18:29   DG Chest 2 View  Result Date: 08/09/2022 CLINICAL DATA:  Shortness of breath EXAM: CHEST - 2 VIEW COMPARISON:  None Available. FINDINGS: The heart size and mediastinal contours are within normal limits. Mild diffuse interstitial pulmonary opacity. The visualized skeletal structures are unremarkable. IMPRESSION: Mild diffuse interstitial pulmonary opacity, consistent with edema or atypical/viral infection. No focal airspace opacity. Electronically Signed   By: Delanna Ahmadi M.D.   On: 08/09/2022 17:30    EKG: My personal interpretation of EKG shows: sinus tachycardia    Assessment/Plan Principal Problem:   Acute respiratory failure with hypoxia (HCC) Active Problems:   Palpitations   Exertional dyspnea   Pulmonary nodule   Factor V Leiden mutation ([place])   Dyslipidemia   History of pulmonary embolism   Essential hypertension    Assessment and Plan: * Acute respiratory failure with hypoxia ([place]) Admit to obs tele bed. Pt does not have overwhelming PNA on CT. In fact, her hypoxia is disproportionate to the level of infiltrate seen. There is no pulmonary edema. Yet she is tachycardic with HR in the 120-130s. Will check procalcitonin. Pt has not had any productive sputum. Has had some sinus  drainage. Also check BNP and echo. Her level of dyspnea with exertion is concerning. She had CT coronary in April 2023 that showed 87% for age without any large plaques. The reason for her hypoxia is still unclear. Pt does heat her home with wood stove.   Check carboxyhemoglobin.  Exertional dyspnea Unclear the cause. Check echo. May need Ziopatch. Check BNP. If echo shows reduced LVEF, consider ischemic evaluation.  Palpitations Check EKG. Check echo. Check BNP. Check TSH.  Essential hypertension Given pt's palpitations and LE edema, will stop norvasc and losartan. Start coreg 6.25 mg bid.  History of pulmonary embolism Stable. Continue Eliquis.  Dyslipidemia Stable. Continue crestor 20 mg daily.  Factor V Leiden mutation ([place]) Stable. Continue Eliquis.  Pulmonary nodule Pt with subcentimeter pulmonary nodule. Pt is a prior smoker. Quit more than 20 years ago.   DVT prophylaxis: Eliquis Code Status: Full Code Family Communication: no family at bedside  Disposition Plan: return home  Consults called: none  Admission status: Observation, Telemetry bed   Kristopher Oppenheim, DO [facility] 08/10/2022, 7:45 PM

## 2022-08-10 NOTE — Assessment & Plan Note (Signed)
Check EKG. Check echo. Check BNP. Check TSH.

## 2022-08-10 NOTE — Assessment & Plan Note (Signed)
Admit to obs tele bed. Pt does not have overwhelming PNA on CT. In fact, her hypoxia is disproportionate to the level of infiltrate seen. There is no pulmonary edema. Yet she is tachycardic with HR in the 120-130s. Will check procalcitonin. Pt has not had any productive sputum. Has had some sinus drainage. Also check BNP and echo. Her level of dyspnea with exertion is concerning. She had CT coronary in April 2023 that showed 87% for age without any large plaques. The reason for her hypoxia is still unclear. Pt does heat her home with wood stove.   Check carboxyhemoglobin.

## 2022-08-10 NOTE — Assessment & Plan Note (Signed)
Pt with subcentimeter pulmonary nodule. Pt is a prior smoker. Quit more than 20 years ago.

## 2022-08-10 NOTE — Assessment & Plan Note (Signed)
Unclear the cause. Check echo. May need Ziopatch. Check BNP. If echo shows reduced LVEF, consider ischemic evaluation.

## 2022-08-10 NOTE — ED Notes (Signed)
Dr Sedonia Small in to see pt.  No new orders received.

## 2022-08-11 ENCOUNTER — Inpatient Hospital Stay (HOSPITAL_COMMUNITY): Payer: Medicare Other

## 2022-08-11 DIAGNOSIS — R600 Localized edema: Secondary | ICD-10-CM | POA: Diagnosis present

## 2022-08-11 DIAGNOSIS — E785 Hyperlipidemia, unspecified: Secondary | ICD-10-CM | POA: Diagnosis present

## 2022-08-11 DIAGNOSIS — Z882 Allergy status to sulfonamides status: Secondary | ICD-10-CM | POA: Diagnosis not present

## 2022-08-11 DIAGNOSIS — Z79899 Other long term (current) drug therapy: Secondary | ICD-10-CM | POA: Diagnosis not present

## 2022-08-11 DIAGNOSIS — J209 Acute bronchitis, unspecified: Secondary | ICD-10-CM | POA: Diagnosis present

## 2022-08-11 DIAGNOSIS — Q2112 Patent foramen ovale: Secondary | ICD-10-CM | POA: Diagnosis not present

## 2022-08-11 DIAGNOSIS — J432 Centrilobular emphysema: Secondary | ICD-10-CM | POA: Diagnosis present

## 2022-08-11 DIAGNOSIS — Z86711 Personal history of pulmonary embolism: Secondary | ICD-10-CM | POA: Diagnosis not present

## 2022-08-11 DIAGNOSIS — J189 Pneumonia, unspecified organism: Secondary | ICD-10-CM | POA: Diagnosis present

## 2022-08-11 DIAGNOSIS — D6851 Activated protein C resistance: Secondary | ICD-10-CM | POA: Diagnosis present

## 2022-08-11 DIAGNOSIS — R0609 Other forms of dyspnea: Secondary | ICD-10-CM | POA: Diagnosis not present

## 2022-08-11 DIAGNOSIS — E876 Hypokalemia: Secondary | ICD-10-CM | POA: Diagnosis present

## 2022-08-11 DIAGNOSIS — Z87891 Personal history of nicotine dependence: Secondary | ICD-10-CM | POA: Diagnosis not present

## 2022-08-11 DIAGNOSIS — J9601 Acute respiratory failure with hypoxia: Secondary | ICD-10-CM | POA: Diagnosis present

## 2022-08-11 DIAGNOSIS — Z888 Allergy status to other drugs, medicaments and biological substances status: Secondary | ICD-10-CM | POA: Diagnosis not present

## 2022-08-11 DIAGNOSIS — Z825 Family history of asthma and other chronic lower respiratory diseases: Secondary | ICD-10-CM | POA: Diagnosis not present

## 2022-08-11 DIAGNOSIS — R002 Palpitations: Secondary | ICD-10-CM | POA: Diagnosis present

## 2022-08-11 DIAGNOSIS — Z1152 Encounter for screening for COVID-19: Secondary | ICD-10-CM | POA: Diagnosis not present

## 2022-08-11 DIAGNOSIS — R911 Solitary pulmonary nodule: Secondary | ICD-10-CM | POA: Diagnosis present

## 2022-08-11 DIAGNOSIS — Z7901 Long term (current) use of anticoagulants: Secondary | ICD-10-CM | POA: Diagnosis not present

## 2022-08-11 DIAGNOSIS — Z8249 Family history of ischemic heart disease and other diseases of the circulatory system: Secondary | ICD-10-CM | POA: Diagnosis not present

## 2022-08-11 DIAGNOSIS — R7989 Other specified abnormal findings of blood chemistry: Secondary | ICD-10-CM | POA: Diagnosis present

## 2022-08-11 DIAGNOSIS — I10 Essential (primary) hypertension: Secondary | ICD-10-CM | POA: Diagnosis present

## 2022-08-11 LAB — RESPIRATORY PANEL BY PCR

## 2022-08-11 LAB — ECHOCARDIOGRAM COMPLETE
Area-P 1/2: 5.75 cm2
Height: 66 in
MV M vel: 5.3 m/s
MV Peak grad: 112.4 mmHg
S' Lateral: 3 cm
Weight: 2917.13 oz

## 2022-08-11 LAB — PROCALCITONIN: Procalcitonin: 0.41 ng/mL

## 2022-08-11 LAB — HEMOGLOBIN A1C
Hgb A1c MFr Bld: 5.8 % — ABNORMAL HIGH (ref 4.8–5.6)
Mean Plasma Glucose: 119.76 mg/dL

## 2022-08-11 LAB — HIV ANTIBODY (ROUTINE TESTING W REFLEX): HIV Screen 4th Generation wRfx: NONREACTIVE

## 2022-08-11 MED ORDER — HYDROCORTISONE (PERIANAL) 2.5 % EX CREA: 1.0000 | TOPICAL_CREAM | Freq: Four times a day (QID) | CUTANEOUS | Status: DC | PRN

## 2022-08-11 MED ORDER — PHENOL 1.4 % MT LIQD: 1.0000 | OROMUCOSAL | Status: DC | PRN

## 2022-08-11 MED ORDER — HYDRALAZINE HCL 20 MG/ML IJ SOLN: 10.0000 mg | INTRAMUSCULAR | Status: DC | PRN

## 2022-08-11 MED ORDER — TRAZODONE HCL 50 MG PO TABS: 50.0000 mg | ORAL_TABLET | Freq: Every evening | ORAL | Status: DC | PRN

## 2022-08-11 MED ORDER — REVEFENACIN 175 MCG/3ML IN SOLN
175.0000 ug | Freq: Every day | RESPIRATORY_TRACT | Status: DC
  Administered 2022-08-12: 175 ug via RESPIRATORY_TRACT
  Filled 2022-08-11 (×2): qty 3

## 2022-08-11 MED ORDER — SENNOSIDES-DOCUSATE SODIUM 8.6-50 MG PO TABS: 1.0000 | ORAL_TABLET | Freq: Every evening | ORAL | Status: DC | PRN

## 2022-08-11 MED ORDER — PREDNISONE 20 MG PO TABS
40.0000 mg | ORAL_TABLET | Freq: Every day | ORAL | Status: DC
  Administered 2022-08-11 – 2022-08-12 (×2): 40 mg via ORAL
  Filled 2022-08-11 (×2): qty 2

## 2022-08-11 MED ORDER — SALINE SPRAY 0.65 % NA SOLN: 1.0000 | NASAL | Status: DC | PRN

## 2022-08-11 MED ORDER — ALUM & MAG HYDROXIDE-SIMETH 200-200-20 MG/5ML PO SUSP: 30.0000 mL | ORAL | Status: DC | PRN

## 2022-08-11 MED ORDER — GUAIFENESIN 100 MG/5ML PO LIQD: 5.0000 mL | ORAL | Status: DC | PRN

## 2022-08-11 MED ORDER — LORATADINE 10 MG PO TABS: 10.0000 mg | ORAL_TABLET | Freq: Every day | ORAL | Status: DC | PRN

## 2022-08-11 MED ORDER — MUSCLE RUB 10-15 % EX CREA: 1.0000 | TOPICAL_CREAM | CUTANEOUS | Status: DC | PRN

## 2022-08-11 MED ORDER — ARFORMOTEROL TARTRATE 15 MCG/2ML IN NEBU
15.0000 ug | INHALATION_SOLUTION | Freq: Two times a day (BID) | RESPIRATORY_TRACT | Status: DC
  Administered 2022-08-11 – 2022-08-12 (×3): 15 ug via RESPIRATORY_TRACT
  Filled 2022-08-11 (×3): qty 2

## 2022-08-11 MED ORDER — DM-GUAIFENESIN ER 30-600 MG PO TB12
1.0000 | ORAL_TABLET | Freq: Two times a day (BID) | ORAL | Status: DC
  Administered 2022-08-11 – 2022-08-12 (×3): 1 via ORAL
  Filled 2022-08-11 (×3): qty 1

## 2022-08-11 MED ORDER — METOPROLOL TARTRATE 5 MG/5ML IV SOLN: 5.0000 mg | INTRAVENOUS | Status: DC | PRN

## 2022-08-11 MED ORDER — HYDROCORTISONE 1 % EX CREA: 1.0000 | TOPICAL_CREAM | Freq: Three times a day (TID) | CUTANEOUS | Status: DC | PRN

## 2022-08-11 MED ORDER — LIP MEDEX EX OINT: 1.0000 | TOPICAL_OINTMENT | CUTANEOUS | Status: DC | PRN

## 2022-08-11 MED ORDER — POLYVINYL ALCOHOL 1.4 % OP SOLN: 1.0000 [drp] | OPHTHALMIC | Status: DC | PRN

## 2022-08-11 MED ORDER — IPRATROPIUM-ALBUTEROL 0.5-2.5 (3) MG/3ML IN SOLN: 3.0000 mL | RESPIRATORY_TRACT | Status: DC | PRN

## 2022-08-11 NOTE — Progress Notes (Signed)
PROGRESS NOTE    Christine Donaldson  RDE:[id number] DOB: Oct 18, 1955 DOA: 08/09/2022 PCP: Ramiro Harvest, PA-C   Brief Narrative:  67 year old with history of prior PE, factor V Leiden mutation on Eliquis, HLD comes to the ED with shortness of breath and dyspnea ongoing for about 1 week prior to admission.  In the ED patient was noted to be hypoxic, tachycardic with elevated blood pressure.  COVID/flu/RSV were negative.  CTA chest negative for PE but showed 4 mm pulmonary nodule with bronchial wall thickening.  Procalcitonin slightly elevated, started on IV Rocephin and azithromycin.   Assessment & Plan:  Principal Problem:   Acute respiratory failure with hypoxia (HCC) Active Problems:   Palpitations   Exertional dyspnea   Pulmonary nodule   Factor V Leiden mutation (HCC)   Dyslipidemia   History of pulmonary embolism   Essential hypertension     Assessment and Plan: * Acute respiratory failure with hypoxia (HCC) Acute bronchitis Suspect patient's symptoms are from acute bronchitis.  CTA chest is negative for pulmonary embolism but evidence of some bronchial wall thickening.  Does have slightly elevated BNP and mildly elevated procalcitonin.  Will continue IV Rocephin and azithromycin.  Supplemental oxygen.  Will start prednisone, I-S/flutter valve, bronchodilators.  Check respiratory panel  Hypokalemia - Repletion  Exertional dyspnea with elevated BNP Palpitations, sinus tachycardia No prior history of CHF.  Echocardiogram ordered.  TSH is normal  Essential hypertension Upon admission Norvasc and losartan was stopped.  Coreg 6.25 mg twice daily was initiated.  Will start IV as needed medications.  History of pulmonary embolism Continue Eliquis  Dyslipidemia Crestor  Factor V Leiden mutation ([place]) Stable. Continue Eliquis.  Pulmonary nodule Pt with subcentimeter pulmonary nodule. Pt is a prior smoker. Quit more than 20 years ago.  Follow-up outpatient pulmonary  care    DVT prophylaxis: Eliquis Code Status: Full code Family Communication: Husband at bedside  Status is: Observation Still has quite a bit abnormal breath sounds.  Continue hospital stay at least for next 24-48 hours   Subjective: Seen and examined at bedside.  Tells me overall she feels better compared to yesterday but still has quite a bit of coughing   Examination:  General exam: Appears calm and comfortable, 3 L nasal cannula Respiratory system: Bilateral diminished breath sounds Cardiovascular system: S1 & S2 heard, RRR. No JVD, murmurs, rubs, gallops or clicks. No pedal edema. Gastrointestinal system: Abdomen is nondistended, soft and nontender. No organomegaly or masses felt. Normal bowel sounds heard. Central nervous system: Alert and oriented. No focal neurological deficits. Extremities: Symmetric 5 x 5 power. Skin: No rashes, lesions or ulcers Psychiatry: Judgement and insight appear normal. Mood & affect appropriate.     Objective: Vitals:   08/10/22 2340 08/11/22 0219 08/11/22 0220 08/11/22 0348  BP: (!) 158/81   (!) 166/87  Pulse: 97   (!) 105  Resp: 18   16  Temp: 98.5 F (36.9 C)   98.6 F (37 C)  TempSrc: Oral   Oral  SpO2: 97%   94%  Weight:  82.7 kg    Height:   '5\' 6"'$  (1.676 m)     Intake/Output Summary (Last 24 hours) at 08/11/2022 0802 Last data filed at 08/11/2022 0424 Gross per 24 hour  Intake 100 ml  Output --  Net 100 ml   Filed Weights   08/11/22 0219  Weight: 82.7 kg     Data Reviewed:   CBC: Recent Labs  Lab 08/09/22 1700 08/10/22 2040  WBC 6.9 6.8  NEUTROABS  --  4.7  HGB 11.7* 10.5*  HCT 34.3* 31.2*  MCV 86.8 87.2  PLT 215 269   Basic Metabolic Panel: Recent Labs  Lab 08/09/22 1700 08/10/22 2040  NA 141 139  K 3.8 3.1*  CL 101 103  CO2 29 26  GLUCOSE 142* 130*  BUN 14 15  CREATININE 0.88 0.96  CALCIUM 10.2 8.7*  MG  --  2.0   GFR: Estimated Creatinine Clearance: 60.8 mL/min (by C-G formula based on  SCr of 0.96 mg/dL). Liver Function Tests: Recent Labs  Lab 08/10/22 2040  AST 27  ALT 25  ALKPHOS 66  BILITOT 0.5  PROT 6.3*  ALBUMIN 3.0*   No results for input(s): "LIPASE", "AMYLASE" in the last 168 hours. No results for input(s): "AMMONIA" in the last 168 hours. Coagulation Profile: Recent Labs  Lab 08/09/22 1700  INR 1.4*   Cardiac Enzymes: No results for input(s): "CKTOTAL", "CKMB", "CKMBINDEX", "TROPONINI" in the last 168 hours. BNP (last 3 results) No results for input(s): "PROBNP" in the last 8760 hours. HbA1C: Recent Labs    08/10/22 2040  HGBA1C 5.8*   CBG: No results for input(s): "GLUCAP" in the last 168 hours. Lipid Profile: No results for input(s): "CHOL", "HDL", "LDLCALC", "TRIG", "CHOLHDL", "LDLDIRECT" in the last 72 hours. Thyroid Function Tests: Recent Labs    08/10/22 2040  TSH 1.694   Anemia Panel: No results for input(s): "VITAMINB12", "FOLATE", "FERRITIN", "TIBC", "IRON", "RETICCTPCT" in the last 72 hours. Sepsis Labs: Recent Labs  Lab 08/10/22 2040  PROCALCITON 0.41    Recent Results (from the past 240 hour(s))  Resp panel by RT-PCR (RSV, Flu A&B, Covid) Nasopharyngeal Swab     Status: None   Collection Time: 08/09/22  5:00 PM   Specimen: Nasopharyngeal Swab; Nasal Swab  Result Value Ref Range Status   SARS Coronavirus 2 by RT PCR NEGATIVE NEGATIVE Final    Comment: (NOTE) SARS-CoV-2 target nucleic acids are NOT DETECTED.  The SARS-CoV-2 RNA is generally detectable in upper respiratory specimens during the acute phase of infection. The lowest concentration of SARS-CoV-2 viral copies this assay can detect is 138 copies/mL. A negative result does not preclude SARS-Cov-2 infection and should not be used as the sole basis for treatment or other patient management decisions. A negative result may occur with  improper specimen collection/handling, submission of specimen other than nasopharyngeal swab, presence of viral mutation(s)  within the areas targeted by this assay, and inadequate number of viral copies(<138 copies/mL). A negative result must be combined with clinical observations, patient history, and epidemiological information. The expected result is Negative.  Fact Sheet for Patients:  EntrepreneurPulse.com.au  Fact Sheet for Healthcare Providers:  IncredibleEmployment.be  This test is no t yet approved or cleared by the [place] FDA and  has been authorized for detection and/or diagnosis of SARS-CoV-2 by FDA under an Emergency Use Authorization (EUA). This EUA will remain  in effect (meaning this test can be used) for the duration of the COVID-19 declaration under Section 564(b)(1) of the Act, 21 U.S.C.section 360bbb-3(b)(1), unless the authorization is terminated  or revoked sooner.       Influenza A by PCR NEGATIVE NEGATIVE Final   Influenza B by PCR NEGATIVE NEGATIVE Final    Comment: (NOTE) The Xpert Xpress SARS-CoV-2/FLU/RSV plus assay is intended as an aid in the diagnosis of influenza from Nasopharyngeal swab specimens and should not be used as a sole basis for treatment. Nasal washings and aspirates  are unacceptable for Xpert Xpress SARS-CoV-2/FLU/RSV testing.  Fact Sheet for Patients: EntrepreneurPulse.com.au  Fact Sheet for Healthcare Providers: IncredibleEmployment.be  This test is not yet approved or cleared by the [place] FDA and has been authorized for detection and/or diagnosis of SARS-CoV-2 by FDA under an Emergency Use Authorization (EUA). This EUA will remain in effect (meaning this test can be used) for the duration of the COVID-19 declaration under Section 564(b)(1) of the Act, 21 U.S.C. section 360bbb-3(b)(1), unless the authorization is terminated or revoked.     Resp Syncytial Virus by PCR NEGATIVE NEGATIVE Final    Comment: (NOTE) Fact Sheet for  Patients: EntrepreneurPulse.com.au  Fact Sheet for Healthcare Providers: IncredibleEmployment.be  This test is not yet approved or cleared by the [place] FDA and has been authorized for detection and/or diagnosis of SARS-CoV-2 by FDA under an Emergency Use Authorization (EUA). This EUA will remain in effect (meaning this test can be used) for the duration of the COVID-19 declaration under Section 564(b)(1) of the Act, 21 U.S.C. section 360bbb-3(b)(1), unless the authorization is terminated or revoked.  Performed at [facility], [address] [place], [place] [address]          Radiology Studies: CT Angio Chest PE W and/or Wo Contrast  Result Date: 08/09/2022 CLINICAL DATA:  Pulmonary embolus suspected EXAM: CT ANGIOGRAPHY CHEST WITH CONTRAST TECHNIQUE: Multidetector CT imaging of the chest was performed using the standard protocol during bolus administration of intravenous contrast. Multiplanar CT image reconstructions and MIPs were obtained to evaluate the vascular anatomy. RADIATION DOSE REDUCTION: This exam was performed according to the departmental dose-optimization program which includes automated exposure control, adjustment of the mA and/or kV according to patient size and/or use of iterative reconstruction technique. CONTRAST:  72m OMNIPAQUE IOHEXOL 350 MG/ML SOLN COMPARISON:  Chest CT dated March 18th 2019 FINDINGS: Cardiovascular: No evidence of pulmonary embolus. Normal caliber thoracic aorta with moderate atherosclerotic disease. Mild coronary artery calcifications. Mediastinum/Nodes: Small hiatal hernia. Thyroid is unremarkable. Mildly enlarged AP window lymph node measuring 1.3 cm in short axis on series 5, image 96. Lungs/Pleura: Central airways are patent. Mild bilateral bronchial wall thickening. Mild centrilobular emphysema. Numerous new tiny scattered centrilobular nodules and nodular consolidations.  Reference new nodular opacity measuring 7 mm on series 5, image 53. Solid lingula measuring 4 mm on series 6, image 86, unchanged when compared with prior, considered benign given greater than 1 year stability. Upper Abdomen: No acute abnormality. Musculoskeletal: No chest wall abnormality. No acute or significant osseous findings. Review of the MIP images confirms the above findings. IMPRESSION: 1. No evidence of pulmonary embolus. 2. Numerous new scattered small centrilobular nodules and nodular consolidations, findings are likely due to infection. Recommend follow-up in 3 months to ensure resolution. 3. Mildly enlarged AP window lymph node, likely reactive. Recommend attention on follow-up. 4. Aortic Atherosclerosis (ICD10-I70.0) and Emphysema (ICD10-J43.9). Electronically Signed   By: LYetta GlassmanM.D.   On: 08/09/2022 18:29   DG Chest 2 View  Result Date: 08/09/2022 CLINICAL DATA:  Shortness of breath EXAM: CHEST - 2 VIEW COMPARISON:  None Available. FINDINGS: The heart size and mediastinal contours are within normal limits. Mild diffuse interstitial pulmonary opacity. The visualized skeletal structures are unremarkable. IMPRESSION: Mild diffuse interstitial pulmonary opacity, consistent with edema or atypical/viral infection. No focal airspace opacity. Electronically Signed   By: ADelanna AhmadiM.D.   On: 08/09/2022 17:30        Scheduled Meds:  apixaban  2.5 mg Oral BID  azithromycin  500 mg Oral QHS   carvedilol  6.25 mg Oral BID WC   fluticasone  2 spray Each Nare Q1500   loratadine  10 mg Oral Daily   rosuvastatin  20 mg Oral QODAY   Continuous Infusions:  cefTRIAXone (ROCEPHIN)  IV Stopped (08/10/22 2040)     LOS: 0 days   Time spent= 35 mins    Perlie Stene Arsenio Loader, MD [facility]  If 7PM-7AM, please contact night-coverage  08/11/2022, 8:02 AM

## 2022-08-11 NOTE — Progress Notes (Signed)
Mobility Specialist - Progress Note   08/11/22 1054  Oxygen Therapy  O2 Device Nasal Cannula  O2 Flow Rate (L/min) 3 L/min  Mobility  Activity Ambulated independently in hallway  Level of Assistance Independent  Assistive Device None  Distance Ambulated (ft) 500 ft  Activity Response Tolerated well  Mobility Referral Yes  $Mobility charge 1 Mobility   Pt received in bed and agreeable to mobility. No complaints during session. Pt to recliner after session with all needs met.    Pre-mobility:  104 HR, 95% SpO2 (3L [place]) During mobility: 110 HR, 96% SpO2 (3L [place]) Post-mobility: 107 HR, 97% SPO2 (3L [place])  Set designer

## 2022-08-11 NOTE — Progress Notes (Signed)
  Echocardiogram 2D Echocardiogram has been performed.  Christine Donaldson 08/11/2022, 2:37 PM

## 2022-08-12 ENCOUNTER — Other Ambulatory Visit (HOSPITAL_COMMUNITY): Payer: Self-pay

## 2022-08-12 DIAGNOSIS — E785 Hyperlipidemia, unspecified: Secondary | ICD-10-CM | POA: Diagnosis not present

## 2022-08-12 DIAGNOSIS — R0609 Other forms of dyspnea: Secondary | ICD-10-CM | POA: Diagnosis not present

## 2022-08-12 DIAGNOSIS — R002 Palpitations: Secondary | ICD-10-CM | POA: Diagnosis not present

## 2022-08-12 DIAGNOSIS — J9601 Acute respiratory failure with hypoxia: Secondary | ICD-10-CM | POA: Diagnosis not present

## 2022-08-12 LAB — BASIC METABOLIC PANEL
Anion gap: 11 (ref 5–15)
BUN: 17 mg/dL (ref 8–23)
CO2: 28 mmol/L (ref 22–32)
Calcium: 9.2 mg/dL (ref 8.9–10.3)
Chloride: 102 mmol/L (ref 98–111)
Creatinine, Ser: 0.77 mg/dL (ref 0.44–1.00)
GFR, Estimated: >60 mL/min (ref >60)
Glucose, Bld: 105 mg/dL — ABNORMAL HIGH (ref 70–99)
Potassium: 4.4 mmol/L (ref 3.5–5.1)
Sodium: 141 mmol/L (ref 135–145)

## 2022-08-12 LAB — CBC
HCT: 29.8 % — ABNORMAL LOW (ref 36.0–46.0)
Hemoglobin: 9.8 g/dL — ABNORMAL LOW (ref 12.0–15.0)
MCH: 29 pg (ref 26.0–34.0)
MCHC: 32.9 g/dL (ref 30.0–36.0)
MCV: 88.2 fL (ref 80.0–100.0)
Platelets: 239 10*3/uL (ref 150–400)
RBC: 3.38 MIL/uL — ABNORMAL LOW (ref 3.87–5.11)
RDW: 12.1 % (ref 11.5–15.5)
WBC: 6.4 10*3/uL (ref 4.0–10.5)
nRBC: 0 % (ref 0.0–0.2)

## 2022-08-12 LAB — MAGNESIUM: Magnesium: 2.1 mg/dL (ref 1.7–2.4)

## 2022-08-12 MED ORDER — PREDNISONE 20 MG PO TABS
40.0000 mg | ORAL_TABLET | Freq: Every day | ORAL | 0 refills | Status: AC
  Filled 2022-08-12: qty 6, 3d supply, fill #0

## 2022-08-12 MED ORDER — CARVEDILOL 6.25 MG PO TABS
6.2500 mg | ORAL_TABLET | Freq: Two times a day (BID) | ORAL | 2 refills | Status: AC
  Filled 2022-08-12: qty 60, 30d supply, fill #0

## 2022-08-12 MED ORDER — CEFDINIR 300 MG PO CAPS
300.0000 mg | ORAL_CAPSULE | Freq: Two times a day (BID) | ORAL | 0 refills | Status: AC
  Filled 2022-08-12: qty 4, 2d supply, fill #0

## 2022-08-12 MED ORDER — AZITHROMYCIN 500 MG PO TABS
500.0000 mg | ORAL_TABLET | Freq: Every evening | ORAL | 0 refills | Status: AC
  Filled 2022-08-12: qty 2, 2d supply, fill #0

## 2022-08-12 MED ORDER — ALBUTEROL SULFATE HFA 108 (90 BASE) MCG/ACT IN AERS
2.0000 | INHALATION_SPRAY | Freq: Four times a day (QID) | RESPIRATORY_TRACT | 1 refills | Status: AC | PRN
  Filled 2022-08-12: qty 6.7, 25d supply, fill #0

## 2022-08-12 NOTE — Hospital Course (Addendum)
67 year old female with past medical history of factor V Leiden mutation with prior pulmonary embolism on Eliquis, hyperlipidemia presenting to [facility] emergency department with complaints of shortness of breath for 1 week.  Upon evaluation in the emergency department patient was found to be in acute hypoxic respiratory failure requiring supplemental oxygen.  CT angiogram of the chest negative for pulmonary embolism but showed some bronchial wall thickening with a 4 mm pulmonary nodule.  Hospitalist group was called to assess the patient for admission the hospital.  Patient was treated for early developing pneumonia or acute bacterial bronchitis with intravenous antibiotics including ceftriaxone and azithromycin.  Procalcitonin was found to be slightly elevated consistent with possible bacterial infection.  Patient was also placed on systemic steroids with prednisone.  Respiratory viral panels were found to be negative.  Patient clinically improved with combination of antibiotics steroids and bronchodilator therapy.  Patient was weaned off of supplemental oxygen to room air.  Patient was able to ambulate without any evidence of dyspnea.  Patient was discharged home in improved and stable condition with the remaining course of her antibiotics and steroids.

## 2022-08-12 NOTE — Tx Team (Signed)
Discharge education given no needs at this time. Husband is to pick up prescribed medications today .  Address given to pharmacy.  No signs of distress. Patient discharged via wheelchair.

## 2022-08-12 NOTE — Progress Notes (Signed)
  Transition of Care [facility]) Screening Note   Patient Details  Name: Christine Donaldson Date of Birth: January 13, 1956   Transition of Care [facility]) CM/SW Contact:    Henrietta Dine, RN Phone Number: 08/12/2022, 10:20 AM    Transition of Care Department [facility]) has reviewed patient and no TOC needs have been identified at this time. We will continue to monitor patient advancement through interdisciplinary progression rounds. If new patient transition needs arise, please place a TOC consult.

## 2022-08-12 NOTE — Discharge Instructions (Signed)
Please take all prescribed medications exactly as instructed.  This includes your course of antibiotics, steroids and a change made to your blood pressure medications (taking you off of Amlodipine and switching you to Coreg). Please increase your activity as tolerated. Please return to the ED if you develop worsening shortness of breath, chest pain, fevers or weakness. Please see your PCP upon next scheduled appointment first week of February.

## 2022-08-12 NOTE — Plan of Care (Signed)

## 2022-08-13 NOTE — Discharge Summary (Signed)
Physician Discharge Summary   Patient: Christine Donaldson MRN: [id number] DOB: May 18, 1956  Admit date:     08/09/2022  Discharge date: 08/12/2022  Discharge Physician: Vernelle Emerald   PCP: Ramiro Harvest, PA-C   Recommendations at discharge:   Patient has been instructed to take her remaining course of antibiotics and steroids to completion. Of note, patient reports recent leg swelling with amlodipine use.  During the course of the hospitalization patient has been transitioned from amlodipine to low-dose Coreg and therefore patient has been discharged on Coreg and has been instructed to discontinue her amlodipine going forward. Patient is been instructed to follow-up closely with her primary care provider Patient has been instructed to return to the emergency department if she develops worsening symptoms.  Discharge Diagnoses: Principal Problem:   Acute respiratory failure with hypoxia (HCC) Active Problems:   Palpitations   Exertional dyspnea   Pulmonary nodule   Factor V Leiden mutation ([place])   Dyslipidemia   History of pulmonary embolism   Essential hypertension   Pneumonia  Resolved Problems:   * No resolved hospital problems. *   Hospital Course: 67 year old female with past medical history of factor V Leiden mutation with prior pulmonary embolism on Eliquis, hyperlipidemia presenting to [facility] emergency department with complaints of shortness of breath for 1 week.  Upon evaluation in the emergency department patient was found to be in acute hypoxic respiratory failure requiring supplemental oxygen.  CT angiogram of the chest negative for pulmonary embolism but showed some bronchial wall thickening with a 4 mm pulmonary nodule.  Hospitalist group was called to assess the patient for admission the hospital.  Patient was treated for early developing pneumonia or acute bacterial bronchitis with intravenous antibiotics including ceftriaxone and azithromycin.   Procalcitonin was found to be slightly elevated consistent with possible bacterial infection.  Patient was also placed on systemic steroids with prednisone.  Respiratory viral panels were found to be negative.  Patient clinically improved with combination of antibiotics steroids and bronchodilator therapy.  Patient was weaned off of supplemental oxygen to room air.  Patient was able to ambulate without any evidence of dyspnea.  Patient was discharged home in improved and stable condition with the remaining course of her antibiotics and steroids.    Consultants: None Procedures performed: None  Disposition: Home Diet recommendation:  Discharge Diet Orders (From admission, onward)     Start     Ordered   08/12/22 0000  Diet - low sodium heart healthy        08/12/22 1102           Cardiac diet  DISCHARGE MEDICATION: Allergies as of 08/12/2022       Reactions   Bupropion Other (See Comments)   Sulfa Antibiotics Hives        Medication List     STOP taking these medications    amLODipine 10 MG tablet Commonly known as: NORVASC       TAKE these medications    albuterol 108 (90 Base) MCG/ACT inhaler Commonly known as: VENTOLIN HFA Inhale 2 puffs into the lungs every 6 (six) hours as needed for wheezing or shortness of breath.   apixaban 2.5 MG Tabs tablet Commonly known as: ELIQUIS Take 2 tablets (5 mg total) by mouth 2 (two) times daily. What changed: how much to take   azithromycin 500 MG tablet Commonly known as: Zithromax Take 1 tablet (500 mg total) by mouth every evening. First dose the evening of 1/20  carvedilol 6.25 MG tablet Commonly known as: COREG Take 1 tablet (6.25 mg total) by mouth 2 (two) times daily with a meal.   cefdinir 300 MG capsule Commonly known as: OMNICEF Take 1 capsule (300 mg total) by mouth 2 (two) times daily for 4 doses. Begin taking the evening of 1/20   cyanocobalamin 1000 MCG tablet Commonly known as: VITAMIN B12 Take  1,000 mcg by mouth daily. liquiod  Also contains folic acid   fluticasone 50 MCG/ACT nasal spray Commonly known as: FLONASE 2 sprays daily in the afternoon.   KRILL OIL PO Take 500 mg by mouth daily.   Loratadine 10 MG Caps Take 10 mg by mouth daily.   losartan 25 MG tablet Commonly known as: COZAAR Take 25 mg by mouth daily.   multivitamin tablet Take 1 tablet by mouth daily.   pantoprazole 40 MG tablet Commonly known as: PROTONIX Take 40 mg by mouth daily.   predniSONE 20 MG tablet Commonly known as: DELTASONE Take 2 tablets (40 mg total) by mouth daily with breakfast for 3 doses. Begin AM of 1/21.   QC TUMERIC COMPLEX PO Take 1 tablet by mouth daily. 1000 mg daily   rosuvastatin 20 MG tablet Commonly known as: CRESTOR Take 1 tablet (20 mg total) by mouth daily. What changed: when to take this   Vitamin D (Cholecalciferol) 25 MCG (1000 UT) Tabs Take 1,000 Units by mouth 2 (two) times daily. VITAMIN D # 3  2000ius daily        Follow-up Information     Ramiro Harvest, PA-C. Go to.   Specialty: Family Medicine Contact information: [place] [address] [phone]                 Discharge Exam: Danley Danker Weights   08/11/22 0219  Weight: 82.7 kg    Constitutional: Awake alert and oriented x3, no associated distress.   Respiratory: clear to auscultation bilaterally, no wheezing, no crackles. Normal respiratory effort. No accessory muscle use.  Cardiovascular: Regular rate and rhythm, no murmurs / rubs / gallops. No extremity edema. 2+ pedal pulses. No carotid bruits.  Abdomen: Abdomen is soft and nontender.  No evidence of intra-abdominal masses.  Positive bowel sounds noted in all quadrants.   Musculoskeletal: No joint deformity upper and lower extremities. Good ROM, no contractures. Normal muscle tone.     Condition at discharge: fair  The results of significant diagnostics from this hospitalization (including imaging,  microbiology, ancillary and laboratory) are listed below for reference.   Imaging Studies: ECHOCARDIOGRAM COMPLETE  Result Date: 08/11/2022    ECHOCARDIOGRAM REPORT   Patient Name:   [name] Date of Exam: 08/11/2022 Medical Rec #:  [id number]         Height:       66.0 in Accession #:    [id number]        Weight:       182.3 lb Date of Birth:  October 21, 1955         BSA:          1.923 m Patient Age:    19 years          BP:           147/84 mmHg Patient Gender: F                 HR:           94 bpm. Exam Location:  Inpatient Procedure: 2D  Echo, Cardiac Doppler and Color Doppler Indications:    Dyspnea R06.00  History:        Patient has prior history of Echocardiogram examinations, most                 recent 09/29/2017. P.E., Signs/Symptoms:Dyspnea and Shortness of                 Breath; Risk Factors:Dyslipidemia, Hypertension and Former                 Smoker.  Sonographer:    Greer Pickerel Referring Phys: [phone] ERIC CHEN  Sonographer Comments: Image acquisition challenging due to respiratory motion and Image acquisition challenging due to patient body habitus. IMPRESSIONS  1. Left ventricular ejection fraction, by estimation, is 60 to 65%. The left ventricle has normal function. The left ventricle has no regional wall motion abnormalities. Left ventricular diastolic parameters were normal.  2. Right ventricular systolic function is normal. The right ventricular size is normal. Tricuspid regurgitation signal is inadequate for assessing PA pressure.  3. The mitral valve is normal in structure. Trivial mitral valve regurgitation.  4. The aortic valve was not well visualized. Aortic valve regurgitation is not visualized. No aortic stenosis is present.  5. The inferior vena cava is normal in size with <50% respiratory variability, suggesting right atrial pressure of 8 mmHg. FINDINGS  Left Ventricle: Left ventricular ejection fraction, by estimation, is 60 to 65%. The left ventricle has normal function. The  left ventricle has no regional wall motion abnormalities. The left ventricular internal cavity size was normal in size. There is  no left ventricular hypertrophy. Left ventricular diastolic parameters were normal. Right Ventricle: The right ventricular size is normal. No increase in right ventricular wall thickness. Right ventricular systolic function is normal. Tricuspid regurgitation signal is inadequate for assessing PA pressure. The tricuspid regurgitant velocity is 0.85 m/s, and with an assumed right atrial pressure of 8 mmHg, the estimated right ventricular systolic pressure is 95.6 mmHg. Left Atrium: Left atrial size was normal in size. Right Atrium: Right atrial size was normal in size. Pericardium: Trivial pericardial effusion is present. Mitral Valve: The mitral valve is normal in structure. Trivial mitral valve regurgitation. Tricuspid Valve: The tricuspid valve is normal in structure. Tricuspid valve regurgitation is trivial. Aortic Valve: The aortic valve was not well visualized. Aortic valve regurgitation is not visualized. No aortic stenosis is present. Pulmonic Valve: The pulmonic valve was not well visualized. Pulmonic valve regurgitation is not visualized. Aorta: The aortic root and ascending aorta are structurally normal, with no evidence of dilitation. Venous: The inferior vena cava is normal in size with less than 50% respiratory variability, suggesting right atrial pressure of 8 mmHg. IAS/Shunts: The interatrial septum was not well visualized.  LEFT VENTRICLE PLAX 2D LVIDd:         4.40 cm   Diastology LVIDs:         3.00 cm   LV e' medial:    7.51 cm/s LV PW:         0.80 cm   LV E/e' medial:  14.6 LV IVS:        0.60 cm   LV e' lateral:   10.00 cm/s LVOT diam:     1.90 cm   LV E/e' lateral: 11.0 LV SV:         54 LV SV Index:   28 LVOT Area:     2.84 cm  RIGHT VENTRICLE RV S prime:  12.30 cm/s TAPSE (M-mode): 1.7 cm LEFT ATRIUM             Index        RIGHT ATRIUM           Index LA  diam:        2.70 cm 1.40 cm/m   RA Area:     13.70 cm LA Vol (A2C):   45.8 ml 23.82 ml/m  RA Volume:   30.10 ml  15.66 ml/m LA Vol (A4C):   66.7 ml 34.69 ml/m LA Biplane Vol: 57.5 ml 29.91 ml/m  AORTIC VALVE LVOT Vmax:   113.00 cm/s LVOT Vmean:  75.800 cm/s LVOT VTI:    0.190 m  AORTA Ao Root diam: 3.20 cm Ao Asc diam:  2.90 cm MITRAL VALVE                TRICUSPID VALVE MV Area (PHT): 5.75 cm     TR Peak grad:   2.9 mmHg MV Decel Time: 132 msec     TR Vmax:        85.10 cm/s MR Peak grad: 112.4 mmHg MR Vmax:      530.00 cm/s   SHUNTS MV E velocity: 110.00 cm/s  Systemic VTI:  0.19 m MV A velocity: 91.20 cm/s   Systemic Diam: 1.90 cm MV E/A ratio:  1.21 Oswaldo Milian MD Electronically signed by Oswaldo Milian MD Signature Date/Time: 08/11/2022/3:42:35 PM    Final    CT Angio Chest PE W and/or Wo Contrast  Result Date: 08/09/2022 CLINICAL DATA:  Pulmonary embolus suspected EXAM: CT ANGIOGRAPHY CHEST WITH CONTRAST TECHNIQUE: Multidetector CT imaging of the chest was performed using the standard protocol during bolus administration of intravenous contrast. Multiplanar CT image reconstructions and MIPs were obtained to evaluate the vascular anatomy. RADIATION DOSE REDUCTION: This exam was performed according to the departmental dose-optimization program which includes automated exposure control, adjustment of the mA and/or kV according to patient size and/or use of iterative reconstruction technique. CONTRAST:  72m OMNIPAQUE IOHEXOL 350 MG/ML SOLN COMPARISON:  Chest CT dated March 18th 2019 FINDINGS: Cardiovascular: No evidence of pulmonary embolus. Normal caliber thoracic aorta with moderate atherosclerotic disease. Mild coronary artery calcifications. Mediastinum/Nodes: Small hiatal hernia. Thyroid is unremarkable. Mildly enlarged AP window lymph node measuring 1.3 cm in short axis on series 5, image 96. Lungs/Pleura: Central airways are patent. Mild bilateral bronchial wall thickening. Mild  centrilobular emphysema. Numerous new tiny scattered centrilobular nodules and nodular consolidations. Reference new nodular opacity measuring 7 mm on series 5, image 53. Solid lingula measuring 4 mm on series 6, image 86, unchanged when compared with prior, considered benign given greater than 1 year stability. Upper Abdomen: No acute abnormality. Musculoskeletal: No chest wall abnormality. No acute or significant osseous findings. Review of the MIP images confirms the above findings. IMPRESSION: 1. No evidence of pulmonary embolus. 2. Numerous new scattered small centrilobular nodules and nodular consolidations, findings are likely due to infection. Recommend follow-up in 3 months to ensure resolution. 3. Mildly enlarged AP window lymph node, likely reactive. Recommend attention on follow-up. 4. Aortic Atherosclerosis (ICD10-I70.0) and Emphysema (ICD10-J43.9). Electronically Signed   By: LYetta GlassmanM.D.   On: 08/09/2022 18:29   DG Chest 2 View  Result Date: 08/09/2022 CLINICAL DATA:  Shortness of breath EXAM: CHEST - 2 VIEW COMPARISON:  None Available. FINDINGS: The heart size and mediastinal contours are within normal limits. Mild diffuse interstitial pulmonary opacity. The visualized skeletal structures are unremarkable. IMPRESSION: Mild diffuse  interstitial pulmonary opacity, consistent with edema or atypical/viral infection. No focal airspace opacity. Electronically Signed   By: Delanna Ahmadi M.D.   On: 08/09/2022 17:30    Microbiology: Results for orders placed or performed during the hospital encounter of 08/09/22  Resp panel by RT-PCR (RSV, Flu A&B, Covid) Nasopharyngeal Swab     Status: None   Collection Time: 08/09/22  5:00 PM   Specimen: Nasopharyngeal Swab; Nasal Swab  Result Value Ref Range Status   SARS Coronavirus 2 by RT PCR NEGATIVE NEGATIVE Final    Comment: (NOTE) SARS-CoV-2 target nucleic acids are NOT DETECTED.  The SARS-CoV-2 RNA is generally detectable in upper  respiratory specimens during the acute phase of infection. The lowest concentration of SARS-CoV-2 viral copies this assay can detect is 138 copies/mL. A negative result does not preclude SARS-Cov-2 infection and should not be used as the sole basis for treatment or other patient management decisions. A negative result may occur with  improper specimen collection/handling, submission of specimen other than nasopharyngeal swab, presence of viral mutation(s) within the areas targeted by this assay, and inadequate number of viral copies(<138 copies/mL). A negative result must be combined with clinical observations, patient history, and epidemiological information. The expected result is Negative.  Fact Sheet for Patients:  EntrepreneurPulse.com.au  Fact Sheet for Healthcare Providers:  IncredibleEmployment.be  This test is no t yet approved or cleared by the [place] FDA and  has been authorized for detection and/or diagnosis of SARS-CoV-2 by FDA under an Emergency Use Authorization (EUA). This EUA will remain  in effect (meaning this test can be used) for the duration of the COVID-19 declaration under Section 564(b)(1) of the Act, 21 U.S.C.section 360bbb-3(b)(1), unless the authorization is terminated  or revoked sooner.       Influenza A by PCR NEGATIVE NEGATIVE Final   Influenza B by PCR NEGATIVE NEGATIVE Final    Comment: (NOTE) The Xpert Xpress SARS-CoV-2/FLU/RSV plus assay is intended as an aid in the diagnosis of influenza from Nasopharyngeal swab specimens and should not be used as a sole basis for treatment. Nasal washings and aspirates are unacceptable for Xpert Xpress SARS-CoV-2/FLU/RSV testing.  Fact Sheet for Patients: EntrepreneurPulse.com.au  Fact Sheet for Healthcare Providers: IncredibleEmployment.be  This test is not yet approved or cleared by the [place] FDA and has been  authorized for detection and/or diagnosis of SARS-CoV-2 by FDA under an Emergency Use Authorization (EUA). This EUA will remain in effect (meaning this test can be used) for the duration of the COVID-19 declaration under Section 564(b)(1) of the Act, 21 U.S.C. section 360bbb-3(b)(1), unless the authorization is terminated or revoked.     Resp Syncytial Virus by PCR NEGATIVE NEGATIVE Final    Comment: (NOTE) Fact Sheet for Patients: EntrepreneurPulse.com.au  Fact Sheet for Healthcare Providers: IncredibleEmployment.be  This test is not yet approved or cleared by the [place] FDA and has been authorized for detection and/or diagnosis of SARS-CoV-2 by FDA under an Emergency Use Authorization (EUA). This EUA will remain in effect (meaning this test can be used) for the duration of the COVID-19 declaration under Section 564(b)(1) of the Act, 21 U.S.C. section 360bbb-3(b)(1), unless the authorization is terminated or revoked.  Performed at [facility], [address], [place], [place] [address]   Respiratory (~20 pathogens) panel by PCR     Status: None   Collection Time: 08/11/22  8:09 AM   Specimen: Nasopharyngeal Swab; Respiratory  Result Value Ref Range Status   Adenovirus NOT  DETECTED NOT DETECTED Final   Coronavirus 229E NOT DETECTED NOT DETECTED Final    Comment: (NOTE) The Coronavirus on the Respiratory Panel, DOES NOT test for the novel  Coronavirus (2019 nCoV)    Coronavirus HKU1 NOT DETECTED NOT DETECTED Final   Coronavirus NL63 NOT DETECTED NOT DETECTED Final   Coronavirus OC43 NOT DETECTED NOT DETECTED Final   Metapneumovirus NOT DETECTED NOT DETECTED Final   Rhinovirus / Enterovirus NOT DETECTED NOT DETECTED Final   Influenza A NOT DETECTED NOT DETECTED Final   Influenza B NOT DETECTED NOT DETECTED Final   Parainfluenza Virus 1 NOT DETECTED NOT DETECTED Final   Parainfluenza Virus 2 NOT DETECTED NOT  DETECTED Final   Parainfluenza Virus 3 NOT DETECTED NOT DETECTED Final   Parainfluenza Virus 4 NOT DETECTED NOT DETECTED Final   Respiratory Syncytial Virus NOT DETECTED NOT DETECTED Final   Bordetella pertussis NOT DETECTED NOT DETECTED Final   Bordetella Parapertussis NOT DETECTED NOT DETECTED Final   Chlamydophila pneumoniae NOT DETECTED NOT DETECTED Final   Mycoplasma pneumoniae NOT DETECTED NOT DETECTED Final    Comment: Performed at [facility], [place] [address] [place], [place] [address]    Labs: CBC: Recent Labs  Lab 08/09/22 1700 08/10/22 2040 08/12/22 0611  WBC 6.9 6.8 6.4  NEUTROABS  --  4.7  --   HGB 11.7* 10.5* 9.8*  HCT 34.3* 31.2* 29.8*  MCV 86.8 87.2 88.2  PLT 215 225 370   Basic Metabolic Panel: Recent Labs  Lab 08/09/22 1700 08/10/22 2040 08/12/22 0611  NA 141 139 141  K 3.8 3.1* 4.4  CL 101 103 102  CO2 '29 26 28  '$ GLUCOSE 142* 130* 105*  BUN '14 15 17  '$ CREATININE 0.88 0.96 0.77  CALCIUM 10.2 8.7* 9.2  MG  --  2.0 2.1   Liver Function Tests: Recent Labs  Lab 08/10/22 2040  AST 27  ALT 25  ALKPHOS 66  BILITOT 0.5  PROT 6.3*  ALBUMIN 3.0*   CBG: No results for input(s): "GLUCAP" in the last 168 hours.  Discharge time spent: greater than 30 minutes.  Signed: Vernelle Emerald, MD [facility] 08/13/2022

## 2022-09-13 DIAGNOSIS — I700 Atherosclerosis of aorta: Secondary | ICD-10-CM | POA: Diagnosis not present

## 2022-09-13 DIAGNOSIS — J432 Centrilobular emphysema: Secondary | ICD-10-CM | POA: Diagnosis not present

## 2022-09-13 DIAGNOSIS — E782 Mixed hyperlipidemia: Secondary | ICD-10-CM | POA: Diagnosis not present

## 2022-09-13 DIAGNOSIS — I2510 Atherosclerotic heart disease of native coronary artery without angina pectoris: Secondary | ICD-10-CM | POA: Diagnosis not present

## 2022-09-13 DIAGNOSIS — I10 Essential (primary) hypertension: Secondary | ICD-10-CM | POA: Diagnosis not present

## 2022-09-13 DIAGNOSIS — D6851 Activated protein C resistance: Secondary | ICD-10-CM | POA: Diagnosis not present

## 2022-09-13 DIAGNOSIS — D6869 Other thrombophilia: Secondary | ICD-10-CM | POA: Diagnosis not present

## 2022-09-20 DIAGNOSIS — N179 Acute kidney failure, unspecified: Secondary | ICD-10-CM | POA: Diagnosis not present

## 2022-09-25 DIAGNOSIS — J449 Chronic obstructive pulmonary disease, unspecified: Secondary | ICD-10-CM | POA: Diagnosis not present

## 2022-09-25 DIAGNOSIS — D649 Anemia, unspecified: Secondary | ICD-10-CM | POA: Diagnosis not present

## 2022-09-25 DIAGNOSIS — Z79899 Other long term (current) drug therapy: Secondary | ICD-10-CM | POA: Diagnosis not present

## 2022-09-25 DIAGNOSIS — K219 Gastro-esophageal reflux disease without esophagitis: Secondary | ICD-10-CM | POA: Diagnosis not present

## 2022-09-25 DIAGNOSIS — Z86010 Personal history of colonic polyps: Secondary | ICD-10-CM | POA: Diagnosis not present

## 2022-09-25 DIAGNOSIS — D6851 Activated protein C resistance: Secondary | ICD-10-CM | POA: Diagnosis not present

## 2022-09-25 DIAGNOSIS — Z86711 Personal history of pulmonary embolism: Secondary | ICD-10-CM | POA: Diagnosis not present

## 2022-10-02 DIAGNOSIS — K2990 Gastroduodenitis, unspecified, without bleeding: Secondary | ICD-10-CM | POA: Diagnosis not present

## 2022-10-02 DIAGNOSIS — K219 Gastro-esophageal reflux disease without esophagitis: Secondary | ICD-10-CM | POA: Diagnosis not present

## 2022-10-02 DIAGNOSIS — K2930 Chronic superficial gastritis without bleeding: Secondary | ICD-10-CM | POA: Diagnosis not present

## 2022-10-02 DIAGNOSIS — D509 Iron deficiency anemia, unspecified: Secondary | ICD-10-CM | POA: Diagnosis not present

## 2022-10-04 DIAGNOSIS — K2930 Chronic superficial gastritis without bleeding: Secondary | ICD-10-CM | POA: Diagnosis not present

## 2022-10-04 DIAGNOSIS — K2990 Gastroduodenitis, unspecified, without bleeding: Secondary | ICD-10-CM | POA: Diagnosis not present

## 2022-10-05 DIAGNOSIS — I10 Essential (primary) hypertension: Secondary | ICD-10-CM | POA: Diagnosis not present

## 2022-10-05 DIAGNOSIS — Z79899 Other long term (current) drug therapy: Secondary | ICD-10-CM | POA: Diagnosis not present

## 2022-10-05 DIAGNOSIS — E782 Mixed hyperlipidemia: Secondary | ICD-10-CM | POA: Diagnosis not present

## 2022-10-05 DIAGNOSIS — D649 Anemia, unspecified: Secondary | ICD-10-CM | POA: Diagnosis not present

## 2022-10-15 IMAGING — MG MM DIGITAL SCREENING BILAT W/ TOMO AND CAD
8 series · 9 of 24 positions shown · non-contrast
Comparison: Previous exam(s).

CLINICAL DATA: Screening.

EXAM:
DIGITAL SCREENING BILATERAL MAMMOGRAM WITH TOMOSYNTHESIS AND CAD
TECHNIQUE: Bilateral screening digital craniocaudal and mediolateral oblique
mammograms were obtained. Bilateral screening digital breast
tomosynthesis was performed. The images were evaluated with
computer-aided detection.

[L MLO synth-2D]
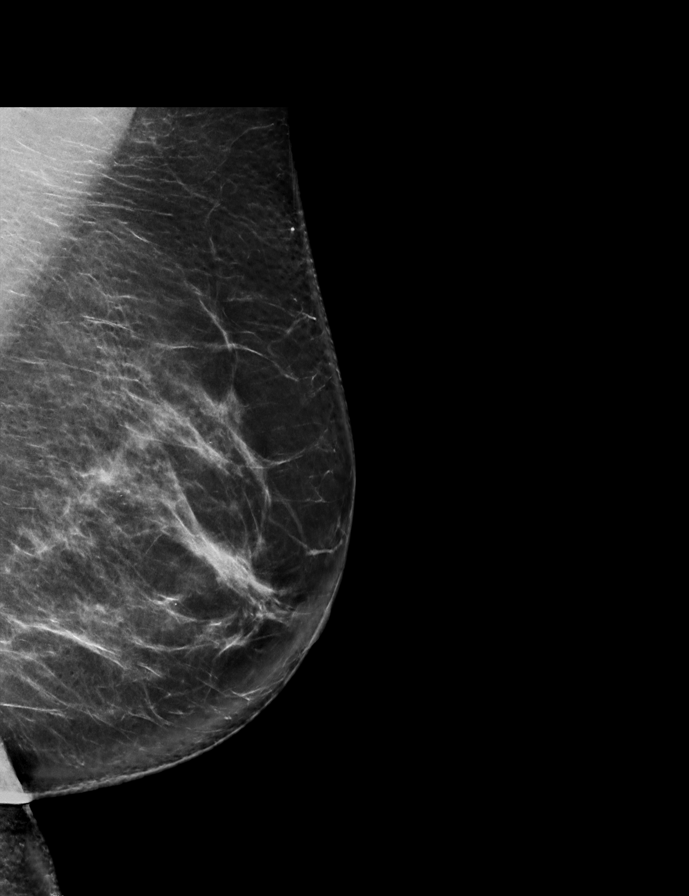

[R MLO synth-2D]
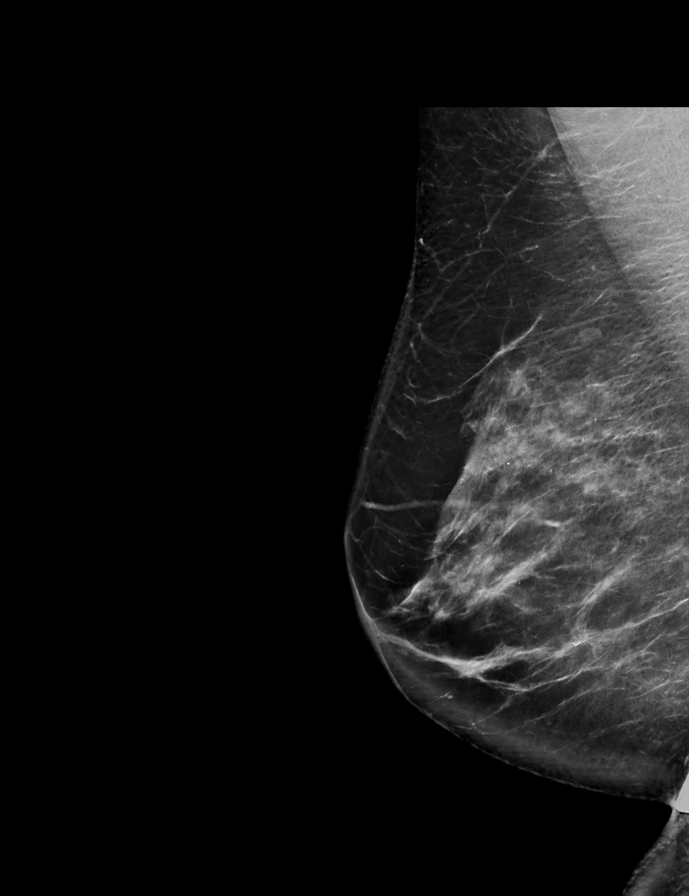

[R CC synth-2D]
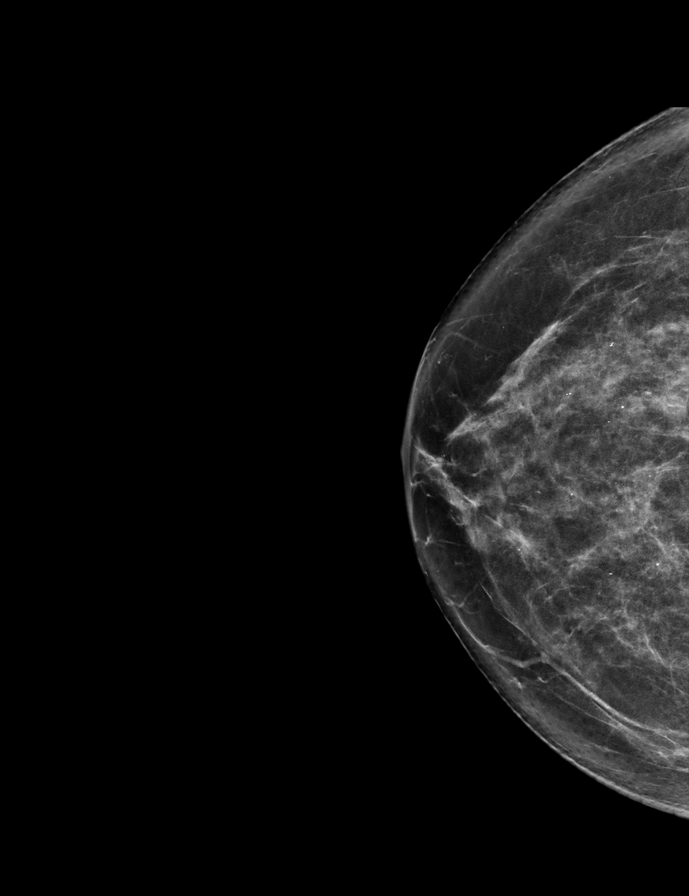

[L CC synth-2D]
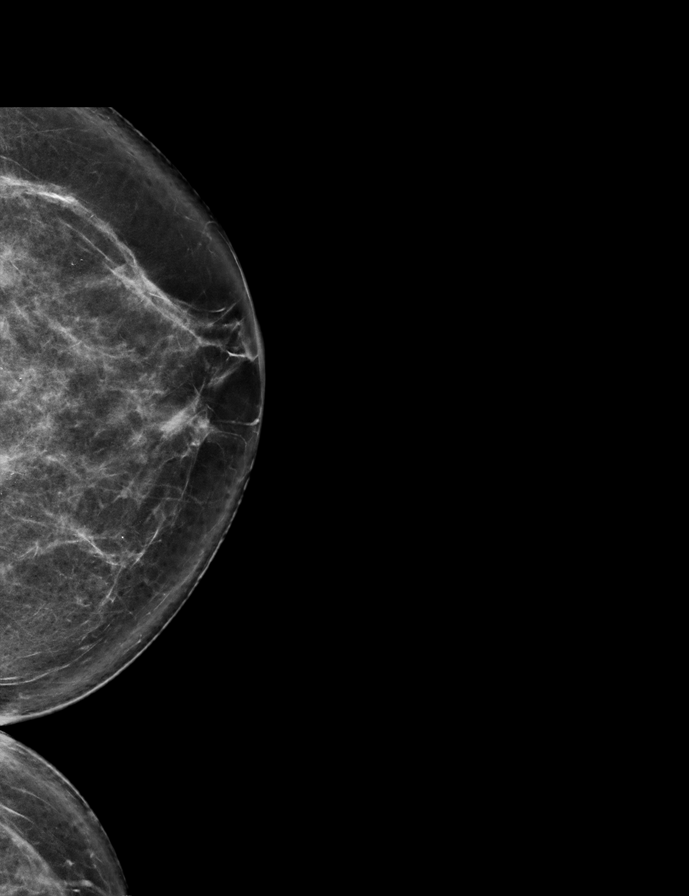

[L CC tomo · 2 of 71 frames shown]
[frame 23/71]
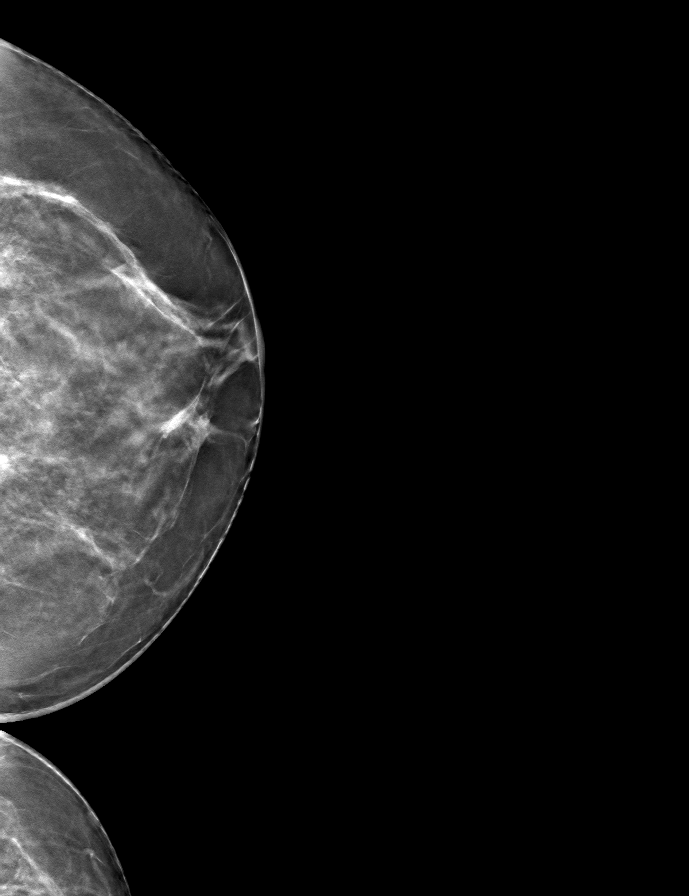
[frame 36/71]
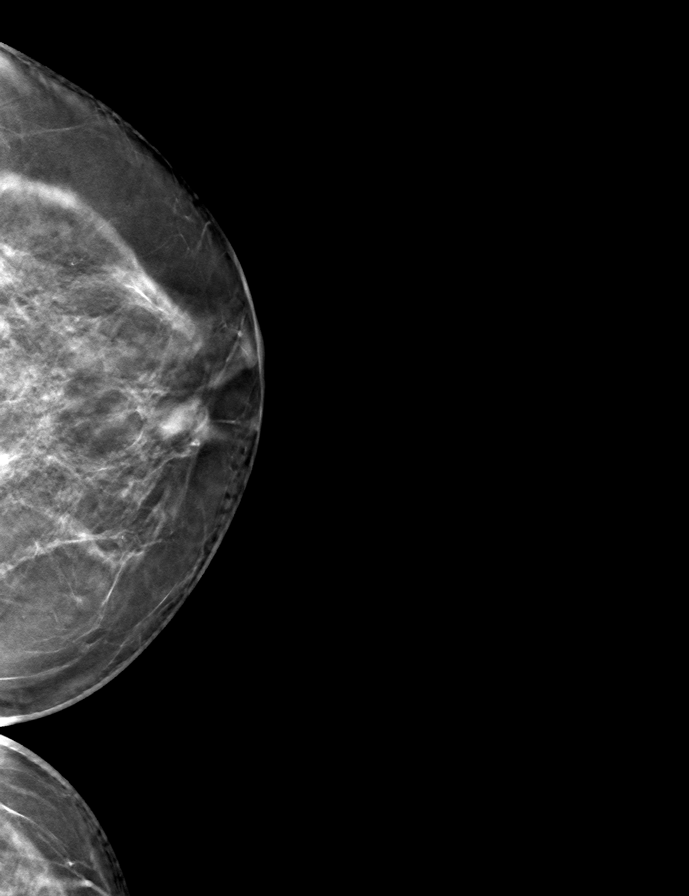

[R MLO tomo · tomo slice 43/86.0]
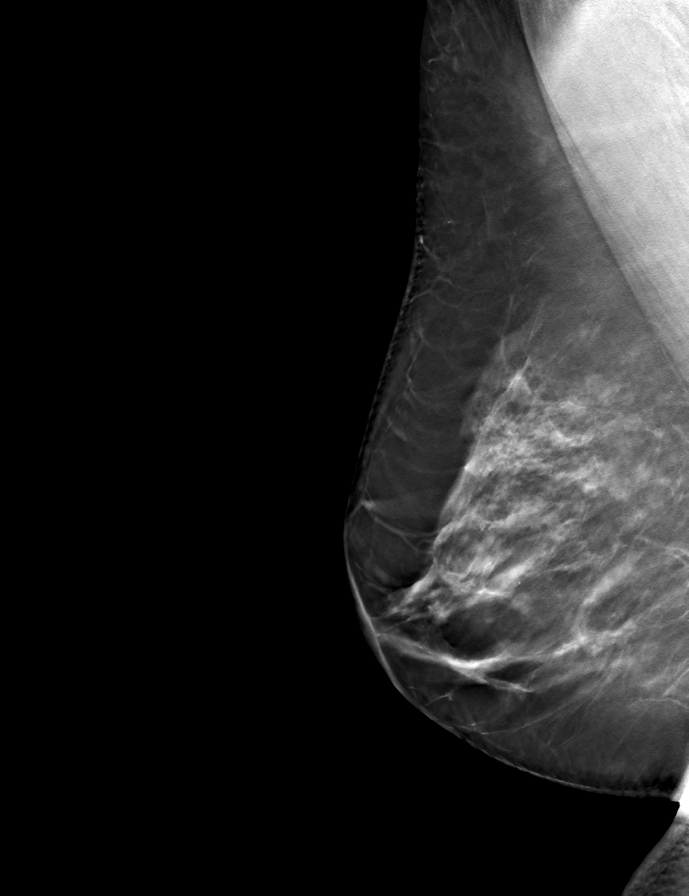

[L MLO tomo · tomo slice 43/84.0]
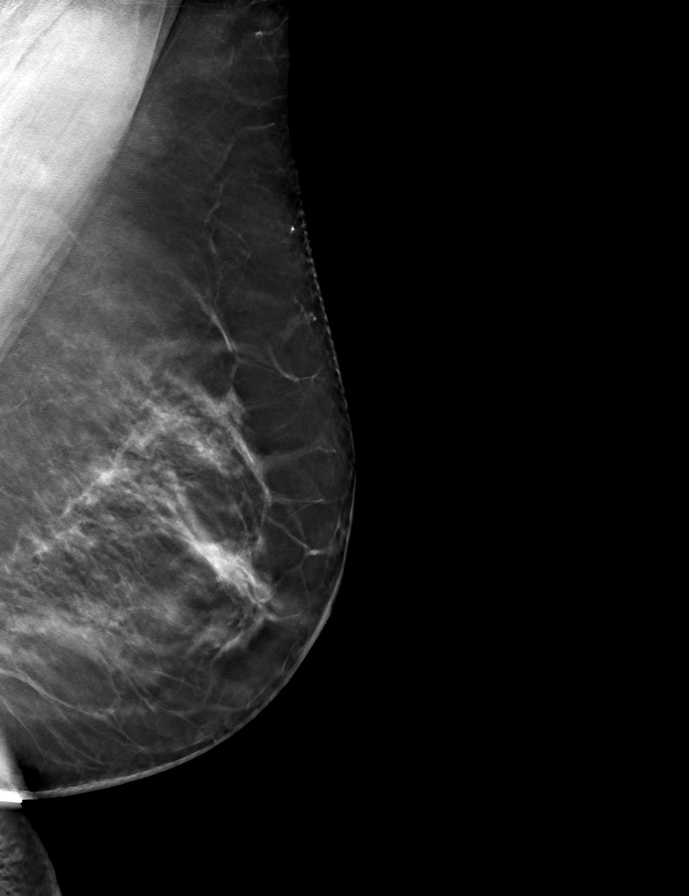

[R CC tomo · tomo slice 35/69.0]
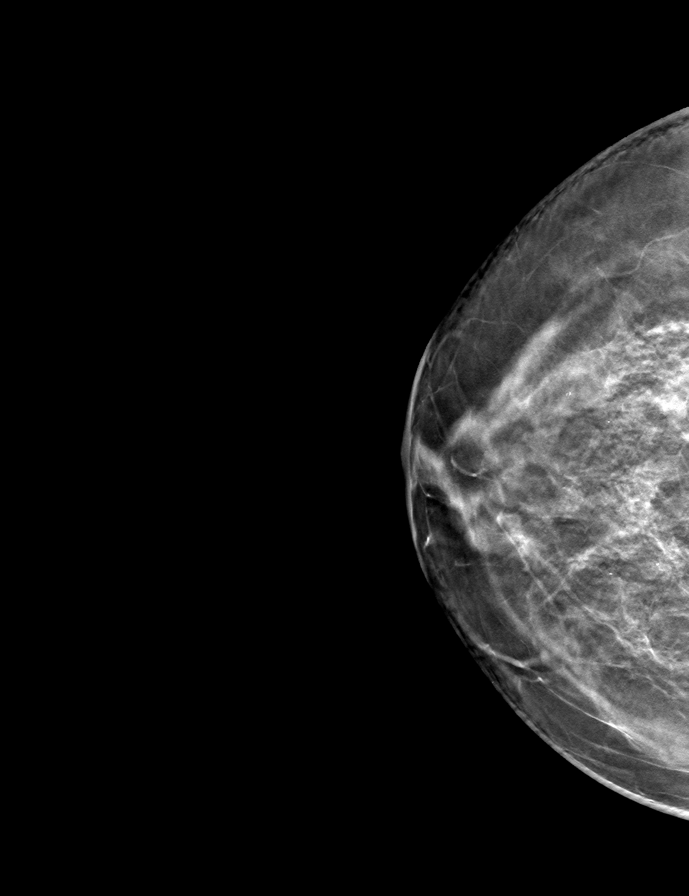

[9 of 24 positions shown; findings below may reference images not displayed]

ACR Breast Density Category c: The breast tissue is heterogeneously
dense, which may obscure small masses.
FINDINGS: There are no findings suspicious for malignancy. The images were
evaluated with computer-aided detection.
IMPRESSION: No mammographic evidence of malignancy. A result letter of this
screening mammogram will be mailed directly to the patient.

RECOMMENDATION:
Screening mammogram in one year. (Code:T4-5-GWO)

BI-RADS CATEGORY  1: Negative.

## 2022-11-03 DIAGNOSIS — L570 Actinic keratosis: Secondary | ICD-10-CM | POA: Diagnosis not present

## 2022-11-03 DIAGNOSIS — L821 Other seborrheic keratosis: Secondary | ICD-10-CM | POA: Diagnosis not present

## 2022-11-03 DIAGNOSIS — L815 Leukoderma, not elsewhere classified: Secondary | ICD-10-CM | POA: Diagnosis not present

## 2022-11-03 DIAGNOSIS — D2272 Melanocytic nevi of left lower limb, including hip: Secondary | ICD-10-CM | POA: Diagnosis not present

## 2022-11-03 DIAGNOSIS — L578 Other skin changes due to chronic exposure to nonionizing radiation: Secondary | ICD-10-CM | POA: Diagnosis not present

## 2022-11-03 DIAGNOSIS — D225 Melanocytic nevi of trunk: Secondary | ICD-10-CM | POA: Diagnosis not present

## 2022-11-03 DIAGNOSIS — D1801 Hemangioma of skin and subcutaneous tissue: Secondary | ICD-10-CM | POA: Diagnosis not present

## 2022-11-03 DIAGNOSIS — C44719 Basal cell carcinoma of skin of left lower limb, including hip: Secondary | ICD-10-CM | POA: Diagnosis not present

## 2022-11-20 ENCOUNTER — Other Ambulatory Visit: Payer: Self-pay | Admitting: Family Medicine

## 2022-11-20 ENCOUNTER — Ambulatory Visit
Admission: RE | Admit: 2022-11-20 | Discharge: 2022-11-20 | Disposition: A | Discharge status: Home / Self Care | Payer: Medicare Other | Source: Ambulatory Visit | Attending: Family Medicine | Admitting: Family Medicine

## 2022-11-20 DIAGNOSIS — S60022S Contusion of left index finger without damage to nail, sequela: Secondary | ICD-10-CM

## 2022-11-20 DIAGNOSIS — I10 Essential (primary) hypertension: Secondary | ICD-10-CM | POA: Diagnosis not present

## 2022-11-20 DIAGNOSIS — R0602 Shortness of breath: Secondary | ICD-10-CM | POA: Diagnosis not present

## 2022-12-05 DIAGNOSIS — N289 Disorder of kidney and ureter, unspecified: Secondary | ICD-10-CM | POA: Diagnosis not present

## 2022-12-05 DIAGNOSIS — I10 Essential (primary) hypertension: Secondary | ICD-10-CM | POA: Diagnosis not present

## 2022-12-06 DIAGNOSIS — D6851 Activated protein C resistance: Secondary | ICD-10-CM | POA: Diagnosis not present

## 2022-12-06 DIAGNOSIS — I2699 Other pulmonary embolism without acute cor pulmonale: Secondary | ICD-10-CM | POA: Diagnosis not present

## 2022-12-06 DIAGNOSIS — D649 Anemia, unspecified: Secondary | ICD-10-CM | POA: Diagnosis not present

## 2022-12-06 DIAGNOSIS — N1832 Chronic kidney disease, stage 3b: Secondary | ICD-10-CM | POA: Diagnosis not present

## 2022-12-06 DIAGNOSIS — N179 Acute kidney failure, unspecified: Secondary | ICD-10-CM | POA: Diagnosis not present

## 2022-12-06 DIAGNOSIS — I129 Hypertensive chronic kidney disease with stage 1 through stage 4 chronic kidney disease, or unspecified chronic kidney disease: Secondary | ICD-10-CM | POA: Diagnosis not present

## 2022-12-12 ENCOUNTER — Other Ambulatory Visit: Payer: Self-pay | Admitting: Nephrology

## 2022-12-12 DIAGNOSIS — N1832 Chronic kidney disease, stage 3b: Secondary | ICD-10-CM

## 2022-12-14 ENCOUNTER — Ambulatory Visit
Admission: RE | Admit: 2022-12-14 | Discharge: 2022-12-14 | Disposition: A | Discharge status: Home / Self Care | Payer: Medicare Other | Source: Ambulatory Visit | Attending: Nephrology | Admitting: Nephrology

## 2022-12-14 DIAGNOSIS — N189 Chronic kidney disease, unspecified: Secondary | ICD-10-CM | POA: Diagnosis not present

## 2022-12-14 DIAGNOSIS — N1832 Chronic kidney disease, stage 3b: Secondary | ICD-10-CM

## 2022-12-22 DIAGNOSIS — D509 Iron deficiency anemia, unspecified: Secondary | ICD-10-CM | POA: Diagnosis not present

## 2023-01-02 DIAGNOSIS — E782 Mixed hyperlipidemia: Secondary | ICD-10-CM | POA: Diagnosis not present

## 2023-01-02 DIAGNOSIS — I10 Essential (primary) hypertension: Secondary | ICD-10-CM | POA: Diagnosis not present

## 2023-01-02 DIAGNOSIS — I700 Atherosclerosis of aorta: Secondary | ICD-10-CM | POA: Diagnosis not present

## 2023-01-02 DIAGNOSIS — I469 Cardiac arrest, cause unspecified: Secondary | ICD-10-CM | POA: Diagnosis not present

## 2023-01-02 DIAGNOSIS — K219 Gastro-esophageal reflux disease without esophagitis: Secondary | ICD-10-CM | POA: Diagnosis not present

## 2023-01-02 DIAGNOSIS — Z862 Personal history of diseases of the blood and blood-forming organs and certain disorders involving the immune mechanism: Secondary | ICD-10-CM | POA: Diagnosis not present

## 2023-01-02 DIAGNOSIS — I499 Cardiac arrhythmia, unspecified: Secondary | ICD-10-CM | POA: Diagnosis not present

## 2023-01-02 DIAGNOSIS — C4372 Malignant melanoma of left lower limb, including hip: Secondary | ICD-10-CM | POA: Diagnosis not present

## 2023-01-02 DIAGNOSIS — R748 Abnormal levels of other serum enzymes: Secondary | ICD-10-CM | POA: Diagnosis not present

## 2023-01-02 DIAGNOSIS — Z86711 Personal history of pulmonary embolism: Secondary | ICD-10-CM | POA: Diagnosis not present

## 2023-01-02 DIAGNOSIS — Z86010 Personal history of colonic polyps: Secondary | ICD-10-CM | POA: Diagnosis not present

## 2023-01-02 DIAGNOSIS — R404 Transient alteration of awareness: Secondary | ICD-10-CM | POA: Diagnosis not present

## 2023-01-02 DIAGNOSIS — R0689 Other abnormalities of breathing: Secondary | ICD-10-CM | POA: Diagnosis not present

## 2023-01-02 DIAGNOSIS — J432 Centrilobular emphysema: Secondary | ICD-10-CM | POA: Diagnosis not present

## 2023-01-02 DIAGNOSIS — R000 Tachycardia, unspecified: Secondary | ICD-10-CM | POA: Diagnosis not present

## 2023-01-02 DIAGNOSIS — Z0000 Encounter for general adult medical examination without abnormal findings: Secondary | ICD-10-CM | POA: Diagnosis not present

## 2023-01-02 DIAGNOSIS — I2510 Atherosclerotic heart disease of native coronary artery without angina pectoris: Secondary | ICD-10-CM | POA: Diagnosis not present

## 2023-01-22 DEATH — deceased
